# Patient Record
Sex: Male | Born: 1940 | Race: White | Hispanic: No | Smoking: Never smoker
Health system: Southern US, Community
[De-identification: ages and names within clinical notes are randomized; demographics above are authoritative.]

## PROBLEM LIST (undated history)

## (undated) DIAGNOSIS — K589 Irritable bowel syndrome without diarrhea: Secondary | ICD-10-CM

## (undated) DIAGNOSIS — E785 Hyperlipidemia, unspecified: Secondary | ICD-10-CM

## (undated) DIAGNOSIS — N400 Enlarged prostate without lower urinary tract symptoms: Secondary | ICD-10-CM

## (undated) DIAGNOSIS — I10 Essential (primary) hypertension: Secondary | ICD-10-CM

## (undated) DIAGNOSIS — N529 Male erectile dysfunction, unspecified: Secondary | ICD-10-CM

## (undated) DIAGNOSIS — M1990 Unspecified osteoarthritis, unspecified site: Secondary | ICD-10-CM

## (undated) DIAGNOSIS — E119 Type 2 diabetes mellitus without complications: Secondary | ICD-10-CM

## (undated) HISTORY — DX: Unspecified osteoarthritis, unspecified site: M19.90

## (undated) HISTORY — DX: Type 2 diabetes mellitus without complications: E11.9

## (undated) HISTORY — DX: Male erectile dysfunction, unspecified: N52.9

## (undated) HISTORY — DX: Hyperlipidemia, unspecified: E78.5

## (undated) HISTORY — DX: Essential (primary) hypertension: I10

## (undated) HISTORY — DX: Benign prostatic hyperplasia without lower urinary tract symptoms: N40.0

## (undated) HISTORY — PX: OTHER SURGICAL HISTORY: SHX169

## (undated) HISTORY — DX: Irritable bowel syndrome, unspecified: K58.9

---

## 1992-04-18 ENCOUNTER — Encounter: Payer: Self-pay | Admitting: Internal Medicine

## 1998-11-04 HISTORY — PX: OTHER SURGICAL HISTORY: SHX169

## 2002-08-10 ENCOUNTER — Encounter: Payer: Self-pay | Admitting: Neurosurgery

## 2002-08-10 ENCOUNTER — Ambulatory Visit (HOSPITAL_COMMUNITY): Admission: RE | Admit: 2002-08-10 | Discharge: 2002-08-10 | Payer: Self-pay | Admitting: Neurosurgery

## 2003-09-16 ENCOUNTER — Encounter: Payer: Self-pay | Admitting: Internal Medicine

## 2004-11-28 ENCOUNTER — Ambulatory Visit: Payer: Self-pay | Admitting: Internal Medicine

## 2004-12-18 ENCOUNTER — Ambulatory Visit: Payer: Self-pay | Admitting: Internal Medicine

## 2005-04-08 ENCOUNTER — Ambulatory Visit: Payer: Self-pay | Admitting: Internal Medicine

## 2005-05-27 ENCOUNTER — Ambulatory Visit: Payer: Self-pay | Admitting: Internal Medicine

## 2005-06-04 HISTORY — PX: US ECHOCARDIOGRAPHY: HXRAD669

## 2005-06-11 ENCOUNTER — Ambulatory Visit: Payer: Self-pay

## 2005-09-23 ENCOUNTER — Ambulatory Visit: Payer: Self-pay | Admitting: Internal Medicine

## 2006-06-19 ENCOUNTER — Ambulatory Visit: Payer: Self-pay | Admitting: Internal Medicine

## 2006-06-20 ENCOUNTER — Ambulatory Visit: Payer: Self-pay | Admitting: Internal Medicine

## 2006-08-13 ENCOUNTER — Encounter: Payer: Self-pay | Admitting: Internal Medicine

## 2006-09-02 ENCOUNTER — Ambulatory Visit: Payer: Self-pay | Admitting: Internal Medicine

## 2006-12-22 ENCOUNTER — Ambulatory Visit: Payer: Self-pay | Admitting: Internal Medicine

## 2007-01-22 ENCOUNTER — Ambulatory Visit: Payer: Self-pay | Admitting: Internal Medicine

## 2007-01-22 LAB — CONVERTED CEMR LAB
ALT: 14 units/L (ref 0–40)
Albumin: 3.6 g/dL (ref 3.5–5.2)
BUN: 14 mg/dL (ref 6–23)
CO2: 32 meq/L (ref 19–32)
Calcium: 8.9 mg/dL (ref 8.4–10.5)
Chloride: 103 meq/L (ref 96–112)
Cholesterol: 206 mg/dL (ref 0–200)
Creatinine, Ser: 0.8 mg/dL (ref 0.4–1.5)
Direct LDL: 145.1 mg/dL
GFR calc Af Amer: 125 mL/min
GFR calc non Af Amer: 103 mL/min
Glucose, Bld: 97 mg/dL (ref 70–99)
HDL: 46.4 mg/dL (ref >39.0)
PSA: 0.43 ng/mL (ref 0.10–4.00)
Phosphorus: 3.8 mg/dL (ref 2.3–4.6)
Potassium: 3.6 meq/L (ref 3.5–5.1)
Sodium: 140 meq/L (ref 135–145)
Total CHOL/HDL Ratio: 4.4
Triglycerides: 96 mg/dL (ref 0–149)
VLDL: 19 mg/dL (ref 0–40)

## 2007-02-06 ENCOUNTER — Ambulatory Visit: Payer: Self-pay | Admitting: Internal Medicine

## 2007-02-06 ENCOUNTER — Encounter: Payer: Self-pay | Admitting: Internal Medicine

## 2007-02-06 DIAGNOSIS — N400 Enlarged prostate without lower urinary tract symptoms: Secondary | ICD-10-CM

## 2007-02-06 DIAGNOSIS — E785 Hyperlipidemia, unspecified: Secondary | ICD-10-CM | POA: Insufficient documentation

## 2007-02-06 DIAGNOSIS — I10 Essential (primary) hypertension: Secondary | ICD-10-CM

## 2007-02-06 DIAGNOSIS — N529 Male erectile dysfunction, unspecified: Secondary | ICD-10-CM | POA: Insufficient documentation

## 2007-02-06 DIAGNOSIS — M5430 Sciatica, unspecified side: Secondary | ICD-10-CM | POA: Insufficient documentation

## 2007-03-06 ENCOUNTER — Ambulatory Visit: Payer: Self-pay | Admitting: Internal Medicine

## 2007-03-06 LAB — CONVERTED CEMR LAB
Albumin: 3.6 g/dL (ref 3.5–5.2)
BUN: 13 mg/dL (ref 6–23)
CO2: 34 meq/L — ABNORMAL HIGH (ref 19–32)
Calcium: 9 mg/dL (ref 8.4–10.5)
Chloride: 106 meq/L (ref 96–112)
Creatinine, Ser: 0.7 mg/dL (ref 0.4–1.5)
GFR calc Af Amer: 146 mL/min
GFR calc non Af Amer: 120 mL/min
Glucose, Bld: 95 mg/dL (ref 70–99)
Phosphorus: 3.3 mg/dL (ref 2.3–4.6)
Potassium: 3.3 meq/L — ABNORMAL LOW (ref 3.5–5.1)
Sodium: 143 meq/L (ref 135–145)

## 2007-08-04 ENCOUNTER — Ambulatory Visit: Payer: Self-pay | Admitting: Internal Medicine

## 2007-08-05 LAB — CONVERTED CEMR LAB
ALT: 20 units/L (ref 0–53)
Albumin: 3.5 g/dL (ref 3.5–5.2)
BUN: 10 mg/dL (ref 6–23)
CO2: 34 meq/L — ABNORMAL HIGH (ref 19–32)
Calcium: 9.2 mg/dL (ref 8.4–10.5)
Chloride: 101 meq/L (ref 96–112)
Cholesterol: 231 mg/dL (ref 0–200)
Creatinine, Ser: 0.8 mg/dL (ref 0.4–1.5)
Direct LDL: 166.1 mg/dL
GFR calc Af Amer: 124 mL/min
GFR calc non Af Amer: 103 mL/min
Glucose, Bld: 105 mg/dL — ABNORMAL HIGH (ref 70–99)
HDL: 38.4 mg/dL — ABNORMAL LOW (ref >39.0)
Phosphorus: 3.2 mg/dL (ref 2.3–4.6)
Potassium: 3.2 meq/L — ABNORMAL LOW (ref 3.5–5.1)
Sodium: 139 meq/L (ref 135–145)
Total CHOL/HDL Ratio: 6
Triglycerides: 122 mg/dL (ref 0–149)
VLDL: 24 mg/dL (ref 0–40)

## 2007-08-07 ENCOUNTER — Ambulatory Visit: Payer: Self-pay | Admitting: Internal Medicine

## 2007-08-07 DIAGNOSIS — G629 Polyneuropathy, unspecified: Secondary | ICD-10-CM

## 2007-08-07 DIAGNOSIS — K589 Irritable bowel syndrome without diarrhea: Secondary | ICD-10-CM

## 2007-08-14 ENCOUNTER — Telehealth: Payer: Self-pay | Admitting: Internal Medicine

## 2007-08-17 ENCOUNTER — Encounter: Payer: Self-pay | Admitting: Internal Medicine

## 2007-08-28 ENCOUNTER — Encounter: Payer: Self-pay | Admitting: Internal Medicine

## 2007-09-07 ENCOUNTER — Ambulatory Visit: Payer: Self-pay | Admitting: Internal Medicine

## 2007-09-08 LAB — CONVERTED CEMR LAB
ALT: 26 units/L (ref 0–53)
Albumin: 3.5 g/dL (ref 3.5–5.2)
BUN: 14 mg/dL (ref 6–23)
CO2: 34 meq/L — ABNORMAL HIGH (ref 19–32)
Calcium: 9 mg/dL (ref 8.4–10.5)
Chloride: 103 meq/L (ref 96–112)
Cholesterol: 179 mg/dL (ref 0–200)
Creatinine, Ser: 0.8 mg/dL (ref 0.4–1.5)
GFR calc Af Amer: 124 mL/min
GFR calc non Af Amer: 103 mL/min
Glucose, Bld: 102 mg/dL — ABNORMAL HIGH (ref 70–99)
HDL: 50.4 mg/dL (ref >39.0)
LDL Cholesterol: 113 mg/dL — ABNORMAL HIGH (ref 0–99)
Phosphorus: 3.5 mg/dL (ref 2.3–4.6)
Potassium: 4 meq/L (ref 3.5–5.1)
Sodium: 141 meq/L (ref 135–145)
TSH: 1.02 microintl units/mL (ref 0.35–5.50)
Total CHOL/HDL Ratio: 3.6
Triglycerides: 76 mg/dL (ref 0–149)
VLDL: 15 mg/dL (ref 0–40)
Vitamin B-12: 274 pg/mL (ref 211–911)

## 2008-02-04 ENCOUNTER — Ambulatory Visit: Payer: Self-pay | Admitting: Internal Medicine

## 2008-02-04 DIAGNOSIS — M47815 Spondylosis without myelopathy or radiculopathy, thoracolumbar region: Secondary | ICD-10-CM

## 2008-02-04 DIAGNOSIS — M159 Polyosteoarthritis, unspecified: Secondary | ICD-10-CM | POA: Insufficient documentation

## 2008-02-04 LAB — CONVERTED CEMR LAB
ALT: 18 units/L (ref 0–53)
AST: 18 units/L (ref 0–37)
Albumin: 3.9 g/dL (ref 3.5–5.2)
Alkaline Phosphatase: 49 units/L (ref 39–117)
BUN: 14 mg/dL (ref 6–23)
Bilirubin, Direct: 0.2 mg/dL (ref 0.0–0.3)
CO2: 33 meq/L — ABNORMAL HIGH (ref 19–32)
Calcium: 9.2 mg/dL (ref 8.4–10.5)
Chloride: 101 meq/L (ref 96–112)
Cholesterol: 160 mg/dL (ref 0–200)
Creatinine, Ser: 0.9 mg/dL (ref 0.4–1.5)
GFR calc Af Amer: 109 mL/min
GFR calc non Af Amer: 90 mL/min
Glucose, Bld: 108 mg/dL — ABNORMAL HIGH (ref 70–99)
HDL: 40.3 mg/dL (ref >39.0)
LDL Cholesterol: 97 mg/dL (ref 0–99)
Phosphorus: 3.6 mg/dL (ref 2.3–4.6)
Potassium: 4 meq/L (ref 3.5–5.1)
Sodium: 140 meq/L (ref 135–145)
Total Bilirubin: 1.9 mg/dL — ABNORMAL HIGH (ref 0.3–1.2)
Total CHOL/HDL Ratio: 4
Total Protein: 6.8 g/dL (ref 6.0–8.3)
Triglycerides: 115 mg/dL (ref 0–149)
VLDL: 23 mg/dL (ref 0–40)
Vitamin B-12: 699 pg/mL (ref 211–911)

## 2008-07-27 ENCOUNTER — Telehealth: Payer: Self-pay | Admitting: Internal Medicine

## 2008-08-03 ENCOUNTER — Encounter: Payer: Self-pay | Admitting: Internal Medicine

## 2008-08-15 ENCOUNTER — Encounter: Payer: Self-pay | Admitting: Internal Medicine

## 2009-02-14 ENCOUNTER — Telehealth: Payer: Self-pay | Admitting: Internal Medicine

## 2009-03-01 ENCOUNTER — Encounter: Payer: Self-pay | Admitting: Internal Medicine

## 2009-08-21 ENCOUNTER — Encounter: Payer: Self-pay | Admitting: Internal Medicine

## 2009-09-06 ENCOUNTER — Ambulatory Visit: Payer: Self-pay | Admitting: Internal Medicine

## 2009-09-08 LAB — CONVERTED CEMR LAB
ALT: 25 units/L (ref 0–53)
AST: 21 units/L (ref 0–37)
Albumin: 4.2 g/dL (ref 3.5–5.2)
Alkaline Phosphatase: 44 units/L (ref 39–117)
BUN: 15 mg/dL (ref 6–23)
Basophils Absolute: 0 10*3/uL (ref 0.0–0.1)
Basophils Relative: 0.3 % (ref 0.0–3.0)
Bilirubin, Direct: 0.1 mg/dL (ref 0.0–0.3)
CO2: 35 meq/L — ABNORMAL HIGH (ref 19–32)
Calcium: 9.4 mg/dL (ref 8.4–10.5)
Chloride: 96 meq/L (ref 96–112)
Cholesterol: 177 mg/dL (ref 0–200)
Creatinine, Ser: 0.9 mg/dL (ref 0.4–1.5)
Direct LDL: 114.7 mg/dL
Eosinophils Absolute: 0.2 10*3/uL (ref 0.0–0.7)
Eosinophils Relative: 2.4 % (ref 0.0–5.0)
GFR calc non Af Amer: 89.06 mL/min (ref >60)
Glucose, Bld: 90 mg/dL (ref 70–99)
HCT: 43.5 % (ref 39.0–52.0)
HDL: 38.5 mg/dL — ABNORMAL LOW (ref >39.00)
Hemoglobin: 14.8 g/dL (ref 13.0–17.0)
Lymphocytes Relative: 23.5 % (ref 12.0–46.0)
Lymphs Abs: 1.8 10*3/uL (ref 0.7–4.0)
MCHC: 34.1 g/dL (ref 30.0–36.0)
MCV: 91.1 fL (ref 78.0–100.0)
Monocytes Absolute: 0.7 10*3/uL (ref 0.1–1.0)
Monocytes Relative: 9.1 % (ref 3.0–12.0)
Neutro Abs: 4.8 10*3/uL (ref 1.4–7.7)
Neutrophils Relative %: 64.7 % (ref 43.0–77.0)
Phosphorus: 3.6 mg/dL (ref 2.3–4.6)
Platelets: 184 10*3/uL (ref 150.0–400.0)
Potassium: 3.4 meq/L — ABNORMAL LOW (ref 3.5–5.1)
RBC: 4.78 M/uL (ref 4.22–5.81)
RDW: 12.5 % (ref 11.5–14.6)
Sodium: 138 meq/L (ref 135–145)
TSH: 0.89 microintl units/mL (ref 0.35–5.50)
Total Bilirubin: 2.3 mg/dL — ABNORMAL HIGH (ref 0.3–1.2)
Total CHOL/HDL Ratio: 5
Total Protein: 6.8 g/dL (ref 6.0–8.3)
Triglycerides: 217 mg/dL — ABNORMAL HIGH (ref 0.0–149.0)
VLDL: 43.4 mg/dL — ABNORMAL HIGH (ref 0.0–40.0)
WBC: 7.5 10*3/uL (ref 4.5–10.5)

## 2009-10-10 ENCOUNTER — Ambulatory Visit: Payer: Self-pay | Admitting: Internal Medicine

## 2009-10-10 LAB — CONVERTED CEMR LAB
Albumin: 3.9 g/dL (ref 3.5–5.2)
BUN: 16 mg/dL (ref 6–23)
CO2: 33 meq/L — ABNORMAL HIGH (ref 19–32)
Calcium: 9.5 mg/dL (ref 8.4–10.5)
Chloride: 100 meq/L (ref 96–112)
Creatinine, Ser: 1 mg/dL (ref 0.4–1.5)
GFR calc non Af Amer: 78.84 mL/min (ref >60)
Glucose, Bld: 99 mg/dL (ref 70–99)
Phosphorus: 3.2 mg/dL (ref 2.3–4.6)
Potassium: 3.5 meq/L (ref 3.5–5.1)
Sodium: 140 meq/L (ref 135–145)

## 2009-10-31 ENCOUNTER — Telehealth: Payer: Self-pay | Admitting: Internal Medicine

## 2009-12-11 ENCOUNTER — Encounter: Payer: Self-pay | Admitting: Internal Medicine

## 2010-04-03 ENCOUNTER — Telehealth: Payer: Self-pay | Admitting: Internal Medicine

## 2010-04-04 ENCOUNTER — Ambulatory Visit: Payer: Self-pay | Admitting: Family Medicine

## 2010-04-04 ENCOUNTER — Telehealth: Payer: Self-pay | Admitting: Internal Medicine

## 2010-08-08 ENCOUNTER — Ambulatory Visit: Payer: Self-pay | Admitting: Internal Medicine

## 2010-08-08 ENCOUNTER — Encounter: Payer: Self-pay | Admitting: Internal Medicine

## 2010-08-09 LAB — CONVERTED CEMR LAB
ALT: 25 units/L (ref 0–53)
AST: 25 units/L (ref 0–37)
Albumin: 4.1 g/dL (ref 3.5–5.2)
Alkaline Phosphatase: 54 units/L (ref 39–117)
BUN: 19 mg/dL (ref 6–23)
Basophils Absolute: 0 10*3/uL (ref 0.0–0.1)
Basophils Relative: 0.3 % (ref 0.0–3.0)
Bilirubin, Direct: 0.1 mg/dL (ref 0.0–0.3)
CO2: 34 meq/L — ABNORMAL HIGH (ref 19–32)
Calcium: 9.8 mg/dL (ref 8.4–10.5)
Chloride: 99 meq/L (ref 96–112)
Cholesterol: 170 mg/dL (ref 0–200)
Creatinine, Ser: 0.9 mg/dL (ref 0.4–1.5)
Eosinophils Absolute: 0.1 10*3/uL (ref 0.0–0.7)
Eosinophils Relative: 1.8 % (ref 0.0–5.0)
GFR calc non Af Amer: 91.15 mL/min (ref >60)
Glucose, Bld: 88 mg/dL (ref 70–99)
HCT: 42.2 % (ref 39.0–52.0)
HDL: 46.8 mg/dL (ref >39.00)
Hemoglobin: 14.6 g/dL (ref 13.0–17.0)
LDL Cholesterol: 91 mg/dL (ref 0–99)
Lymphocytes Relative: 28.6 % (ref 12.0–46.0)
Lymphs Abs: 2.1 10*3/uL (ref 0.7–4.0)
MCHC: 34.5 g/dL (ref 30.0–36.0)
MCV: 89.6 fL (ref 78.0–100.0)
Monocytes Absolute: 0.7 10*3/uL (ref 0.1–1.0)
Monocytes Relative: 9.7 % (ref 3.0–12.0)
Neutro Abs: 4.4 10*3/uL (ref 1.4–7.7)
Neutrophils Relative %: 59.6 % (ref 43.0–77.0)
Phosphorus: 3.2 mg/dL (ref 2.3–4.6)
Platelets: 195 10*3/uL (ref 150.0–400.0)
Potassium: 3.8 meq/L (ref 3.5–5.1)
RBC: 4.71 M/uL (ref 4.22–5.81)
RDW: 13.8 % (ref 11.5–14.6)
Sodium: 140 meq/L (ref 135–145)
TSH: 1.26 microintl units/mL (ref 0.35–5.50)
Total Bilirubin: 1.5 mg/dL — ABNORMAL HIGH (ref 0.3–1.2)
Total CHOL/HDL Ratio: 4
Total Protein: 7 g/dL (ref 6.0–8.3)
Triglycerides: 160 mg/dL — ABNORMAL HIGH (ref 0.0–149.0)
VLDL: 32 mg/dL (ref 0.0–40.0)
WBC: 7.3 10*3/uL (ref 4.5–10.5)

## 2010-08-10 ENCOUNTER — Telehealth: Payer: Self-pay | Admitting: Internal Medicine

## 2010-08-22 ENCOUNTER — Encounter: Payer: Self-pay | Admitting: Internal Medicine

## 2010-09-03 ENCOUNTER — Encounter (INDEPENDENT_AMBULATORY_CARE_PROVIDER_SITE_OTHER): Payer: Self-pay | Admitting: *Deleted

## 2010-09-05 ENCOUNTER — Ambulatory Visit: Payer: Self-pay | Admitting: Internal Medicine

## 2010-09-19 ENCOUNTER — Ambulatory Visit: Payer: Self-pay | Admitting: Internal Medicine

## 2010-09-19 LAB — HM COLONOSCOPY

## 2010-09-20 ENCOUNTER — Encounter: Payer: Self-pay | Admitting: Internal Medicine

## 2010-12-04 NOTE — Progress Notes (Signed)
Summary: Diclofenac Sodium  Phone Note Call from Patient Call back at Home Phone [phone]   Caller: Patient Call For: Cindee Salt MD Summary of Call: patient came in this morning to see Dr. Dayton Martes for tick bites, pt then wanted a physical accessment form filled out and refill for Diclofenac Sodium, please advise if refill can be done, this was denied by Dr. Artis Flock, pt was told to see Dr. Wilkie Aye. Fax was dated 02/26/2010. Rx on your desk  Initial call taken by: Mervin Hack CMA Duncan Dull),  April 04, 2010 10:22 AM  Follow-up for Phone Call        left message on at home with wife for patient to return my call.  DeShannon Katrinka Blazing CMA Duncan Dull)  April 04, 2010 10:23 AM   Okay to refill #60 x 0 I assume he is just using it as needed  we can discuss this further at his July visit Follow-up by: Cindee Salt MD,  April 04, 2010 10:49 AM  Additional Follow-up for Phone Call Additional follow up Details #1::        patient states he doesn't need the refill, [facility] already refilled, patient states he just needs it recorded that Dr. Alphonsus Sias refilled it? DeShannon Smith CMA Duncan Dull)  April 04, 2010 12:02  Please just add it to his med list if he will be using at times Cindee Salt MD  April 04, 2010 1:34 PM   done Additional Follow-up by: Mervin Hack CMA Duncan Dull),  April 04, 2010 4:15 PM    New/Updated Medications: DICLOFENAC SODIUM 50 MG TBEC (DICLOFENAC SODIUM) use as directed

## 2010-12-04 NOTE — Miscellaneous (Signed)
Summary: Health Care Power of [facility] Power of Attorney   Imported By: Beau Fanny 08/08/2010 14:47:19  _____________________________________________________________________  External Attachment:    Type:   Image     Comment:   External Document

## 2010-12-04 NOTE — Letter (Signed)
Summary: [facility]   Imported By: Lanelle Bal 08/30/2010 13:01:33  _____________________________________________________________________  External Attachment:    Type:   Image     Comment:   External Document  Appended Document: [facility] BPH okay on avodart PSA drawn

## 2010-12-04 NOTE — Progress Notes (Signed)
Summary: ??infected tick bite  Phone Note Call from Patient Call back at Home Phone [phone]   Caller: Patient Call For: Cindee Salt MD Summary of Call: Patient sayst that he has a couple of tick bites that look infected. They are very red, and feel hard, and warm to the touch. He feels fine and has not had fever.  He has an appt with dr. Dayton Martes tomorrow, he wants to know if he needs to be seen today or if it is okay to wait until tomorrow.  Initial call taken by: Melody Comas,  Apr 03, 2010 9:19 AM  Follow-up for Phone Call        probably can wait till tomorrow but if noone else can really fit him in today, add on at 1PM for me Follow-up by: Cindee Salt MD,  Apr 03, 2010 10:00 AM  Additional Follow-up for Phone Call Additional follow up Details #1::        pt states he can't come today at 1:00, he will be seen by Dr. Dayton Martes tomorrow.  DeShannon Smith CMA Duncan Dull)  Apr 03, 2010 10:26 AM   Okay Additional Follow-up by: Cindee Salt MD,  Apr 03, 2010 10:41 AM

## 2010-12-04 NOTE — Progress Notes (Signed)
Summary: Previst Letter  Phone Note Call from Patient Call back at Home Phone [phone]   Caller: Patient Call For: Dr. Marina Goodell Reason for Call: Talk to Nurse Summary of Call: Calling about his Pre-visit letter he received in the mail Initial call taken by: Karna Christmas,  August 10, 2010 9:09 AM  Follow-up for Phone Call        called patient and left message with wife for him to return my call. Milford Cage [facility]  August 10, 2010 9:48 AM Pt. called back and advised that he had a colonoscopy 10 years ago in [place].  Note sent to Dr. Marina Goodell regarding what he wants to do regarding his procedure.  Milford Cage [facility]  August 10, 2010 9:56 AM

## 2010-12-04 NOTE — Letter (Signed)
Summary: [facility] Instructions  [facility]  [address] [place], [place] [address]   Phone: [phone]  Fax: [phone]       Brendan Barnes    Apr 15, 1941    MRN: [id number]        Procedure Day Dorna Bloom:  Wednesday 09/19/2010     Arrival Time: 10:30 am      Procedure Time: 11:30 am     Location of Procedure:                    _x _  [facility] (4th Floor)                        PREPARATION FOR COLONOSCOPY WITH MOVIPREP   Starting 5 days prior to your procedure Friday 09/14/10 do not eat nuts, seeds, popcorn, corn, beans, peas,  salads, or any raw vegetables.  Do not take any fiber supplements (e.g. Metamucil, Citrucel, and Benefiber).  THE DAY BEFORE YOUR PROCEDURE         DATE: Tuesday  09/18/10  1.  Drink clear liquids the entire day-NO SOLID FOOD  2.  Do not drink anything colored red or purple.  Avoid juices with pulp.  No orange juice.  3.  Drink at least 64 oz. (8 glasses) of fluid/clear liquids during the day to prevent dehydration and help the prep work efficiently.  CLEAR LIQUIDS INCLUDE: Water Jello Ice Popsicles Tea (sugar ok, no milk/cream) Powdered fruit flavored drinks Coffee (sugar ok, no milk/cream) Gatorade Juice: apple, white grape, white cranberry  Lemonade Clear bullion, consomm, broth Carbonated beverages (any kind) Strained chicken noodle soup Hard Candy                             4.  In the morning, mix first dose of MoviPrep solution:    Empty 1 Pouch A and 1 Pouch B into the disposable container    Add lukewarm drinking water to the top line of the container. Mix to dissolve    Refrigerate (mixed solution should be used within 24 hrs)  5.  Begin drinking the prep at 5:00 p.m. The MoviPrep container is divided by 4 marks.   Every 15 minutes drink the solution down to the next mark (approximately 8 oz) until the full liter is complete.   6.  Follow completed prep with 16 oz of clear liquid of your  choice (Nothing red or purple).  Continue to drink clear liquids until bedtime.  7.  Before going to bed, mix second dose of MoviPrep solution:    Empty 1 Pouch A and 1 Pouch B into the disposable container    Add lukewarm drinking water to the top line of the container. Mix to dissolve    Refrigerate  THE DAY OF YOUR PROCEDURE      DATE:  Wednesday  09/19/10  Beginning at 6:30 a.m. (5 hours before procedure):         1. Every 15 minutes, drink the solution down to the next mark (approx 8 oz) until the full liter is complete.  2. Follow completed prep with 16 oz. of clear liquid of your choice.    3. You may drink clear liquids until 9:30 am (2 HOURS BEFORE PROCEDURE).   MEDICATION INSTRUCTIONS  Unless otherwise instructed, you should take regular prescription medications with a small sip of water   as early as possible  the morning of your procedure.  Additional medication instructions: Hold Lisinopril/HCTZ the morning of procedure.         OTHER INSTRUCTIONS  You will need a responsible adult at least 70 years of age to accompany you and drive you home.   This person must remain in the waiting room during your procedure.  Wear loose fitting clothing that is easily removed.  Leave jewelry and other valuables at home.  However, you may wish to bring a book to read or  an iPod/MP3 player to listen to music as you wait for your procedure to start.  Remove all body piercing jewelry and leave at home.  Total time from sign-in until discharge is approximately 2-3 hours.  You should go home directly after your procedure and rest.  You can resume normal activities the  day after your procedure.  The day of your procedure you should not:   Drive   Make legal decisions   Operate machinery   Drink alcohol   Return to work  You will receive specific instructions about eating, activities and medications before you leave.    The above instructions have been reviewed  and explained to me by   Wyona Almas RN  September 05, 2010 10:34 AM     I fully understand and can verbalize these instructions _____________________________ Date _________

## 2010-12-04 NOTE — Progress Notes (Signed)
Summary: pre-visit letter/colonoscopy  Phone Note Call from Patient   Caller: Patient Call For: Milford Cage, CMA Summary of Call: Patient received the pre-visit letter and he has had a colonoscopy 10 years ago in [place].  He is not sure who did the procedure or if he can get a copy of this report.  Do you want me to try and get the report, schedule an office visit,  or just keep the direct colonoscopy appt. as scheduled?  Please advise-thanks.   (Pt. called back and did find the report.  It was done on 06/30/2000 by Dr. Telford Nab in [place].  Results-Normal and recommended colon in 10 years.)  Milford Cage [facility]  August 10, 2010 10:14 AM  Initial call taken by: Milford Cage NCMA,  August 10, 2010 9:54 AM  Follow-up for Phone Call        keep plans for direct colonoscopy Follow-up by: Hilarie Fredrickson MD,  August 11, 2010 12:44 PM  Additional Follow-up for Phone Call Additional follow up Details #1::        pt. notified.   Additional Follow-up by: Milford Cage NCMA,  August 13, 2010 8:22 AM

## 2010-12-04 NOTE — Procedures (Signed)
Summary: Colonoscopy  Patient: Keaton Beichner Note: All result statuses are Final unless otherwise noted.  Tests: (1) Colonoscopy (COL)   COL Colonoscopy           DONE     [facility]     [address]     [place], [place]  [address]           COLONOSCOPY PROCEDURE REPORT           PATIENT:  Edwing, Figley  MR#:  [id number]     BIRTHDATE:  [birth year], 69 yrs. old  GENDER:  male     ENDOSCOPIST:  Wilhemina Bonito. Eda Keys, MD     REF. BY:  Tillman Abide, M.D.     PROCEDURE DATE:  09/19/2010     PROCEDURE:  Colonoscopy with snare polypectomy x 2     ASA CLASS:  Class II     INDICATIONS:  Routine Risk Screening ; [place]     in [place] w/o polyps     MEDICATIONS:   Fentanyl 100 mcg IV, Versed 9 mg IV           DESCRIPTION OF PROCEDURE:   After the risks benefits and     alternatives of the procedure were thoroughly explained, informed     consent was obtained.  Digital rectal exam was performed and     revealed no abnormalities.   The LB 180AL [id number] endoscope was     introduced through the anus and advanced to the cecum, which was     identified by both the appendix and ileocecal valve, without     limitations.Time to cecum = 4:00 min.  The quality of the prep was     excellent, using MoviPrep.  The instrument was then slowly     withdrawn (time = 17:00 min) as the colon was fully examined.     <<PROCEDUREIMAGES>>           FINDINGS:  Two 3mm polyps were found in the cecum and ascending     colon respectively. Polyps were snared without cautery. Retrieval     was successful.  Mild diverticulosis was found in the sigmoid     colon.   Retroflexed views in the rectum revealed no     abnormalities.    The scope was then withdrawn from the patient     and the procedure completed.           COMPLICATIONS:  None     ENDOSCOPIC IMPRESSION:     1) Two polyps - removed     2) Mild diverticulosis in the sigmoid colon     3) Normal colonoscopy otherwise         RECOMMENDATIONS:     1) Repeat colonoscopy in 5 years if polyp adenomatous; otherwise     10 years           ______________________________     Wilhemina Bonito. Eda Keys, MD           CC:  Karie Schwalbe, MD;  The Patient           n.     eSIGNED:   Wilhemina Bonito. Eda Keys at 09/19/2010 01:17 PM           Weston Settle, [id number]  Note: An exclamation mark (!) indicates a result that was not dispersed into the flowsheet. Document Creation Date: 09/19/2010 1:17 PM _______________________________________________________________________  (1) Order result status: Final Collection or  observation date-time: 09/19/2010 13:09 Requested date-time:  Receipt date-time:  Reported date-time:  Referring Physician:   Ordering Physician: Fransico Setters [id number]) Specimen Source:  Source: Launa Grill Order Number: [id number] Lab site:   Appended Document: Colonoscopy recall 5 yrs     Procedures Next Due Date:    Colonoscopy: 10/2015

## 2010-12-04 NOTE — Miscellaneous (Signed)
Summary: [facility] Previsit/prep  Clinical Lists Changes  Medications: Added new medication of MOVIPREP 100 GM  SOLR (PEG-KCL-NACL-NASULF-NA ASC-C) As per prep instructions. - Signed Rx of MOVIPREP 100 GM  SOLR (PEG-KCL-NACL-NASULF-NA ASC-C) As per prep instructions.;  #1 x 0;  Signed;  Entered by: Wyona Almas RN;  Authorized by: Hilarie Fredrickson MD;  Method used: Electronically to [facility] [address] [place], [place], [place]  [address], Ph: [phone], Fax: [phone] Allergies: Removed allergy or adverse reaction of LEVSIN (HYOSCYAMINE SULFATE)    Prescriptions: MOVIPREP 100 GM  SOLR (PEG-KCL-NACL-NASULF-NA ASC-C) As per prep instructions.  #1 x 0   Entered by:   Wyona Almas RN   Authorized by:   Hilarie Fredrickson MD   Signed by:   Wyona Almas RN on 09/05/2010   Method used:   Electronically to        [facility] (retail)       [address]       [place], [place]  [address]       Ph: [phone]       Fax: [phone]   RxID:   [id number]

## 2010-12-04 NOTE — Letter (Signed)
Summary: Pre Visit Letter Revised  [facility]  [address] [place], [place] [address]   Phone: [phone]  Fax: [phone]        08/08/2010 MRN: [id number] [facility] [address] [place], [place]  [address]             Procedure Date:  11-16 at 11:30am  Welcome to the Gastroenterology Division at [facility].    You are scheduled to see a nurse for your pre-procedure visit on 09-05-10 at 11am  on the 3rd floor at [facility], [address]  We ask that you try to arrive at our office 15 minutes prior to your appointment time to allow for check-in.  Please take a minute to review the attached form.  If you answer "Yes" to one or more of the questions on the first page, we ask that you call the person listed at your earliest opportunity.  If you answer "No" to all of the questions, please complete the rest of the form and bring it to your appointment.    Your nurse visit will consist of discussing your medical and surgical history, your immediate family medical history, and your medications.   If you are unable to list all of your medications on the form, please bring the medication bottles to your appointment and we will list them.  We will need to be aware of both prescribed and over the counter drugs.  We will need to know exact dosage information as well.    Please be prepared to read and sign documents such as consent forms, a financial agreement, and acknowledgement forms.  If necessary, and with your consent, a friend or relative is welcome to sit-in on the nurse visit with you.  Please bring your insurance card so that we may make a copy of it.  If your insurance requires a referral to see a specialist, please bring your referral form from your primary care physician.  No co-pay is required for this nurse visit.     If you cannot keep your appointment, please call [phone] to cancel or reschedule prior to your appointment date.   This allows [place] the opportunity to schedule an appointment for another patient in need of care.    Thank you for choosing  Gastroenterology for your medical needs.  We appreciate the opportunity to care for you.  Please visit [place] at our website  to learn more about our practice.  Sincerely, The Gastroenterology Division

## 2010-12-04 NOTE — Assessment & Plan Note (Signed)
Summary: TICK BITE/CLE   Vital Signs:  Patient profile:   70 year old male Height:      71.5 inches Weight:      218.13 pounds BMI:     30.11 Temp:     97.6 degrees F oral Pulse rate:   64 / minute Pulse rhythm:   regular BP sitting:   132 / 88  (left arm) Cuff size:   regular  Vitals Entered By: Linde Gillis CMA Duncan Dull) (April 04, 2010 9:37 AM) CC: ? tick bite   History of Present Illness: 70 yo here for ? infected tick bite.  Pulled two ticks off of him two days ago, one on left thigh, one on left upper back.   Has had multiple tick bites in past, developped red area around one on thigh that is typical for his previous tick bite but feels red area on his back is much larger. No rash evident elsewhere. No malaise, fever, chills, body aches, joint pain, headache or other systemic symptoms.  Current Medications (verified): 1)  Aspirin 81 Mg  Tbec (Aspirin) .... Take One By Mouth Once A Day 2)  Avodart 0.5 Mg  Caps (Dutasteride) .... Take One By Mouth Once A Day 3)  Omega-3 1000 Mg  Caps (Omega-3 Fatty Acids) .... Take One By Mouth Once A Day 4)  Prinzide 20-25 Mg  Tabs (Lisinopril-Hydrochlorothiazide) .... Take 1 Tablet By Mouth Once A Day 5)  Simvastatin 40 Mg  Tabs (Simvastatin) .Marland Kitchen.. 1 Daily 6)  Calcium 500 Mg Tabs (Calcium Carbonate) .... Take 2 By Mouth Once Daily (1000mg ) 7)  Amlodipine Besylate 5 Mg Tabs (Amlodipine Besylate) .Marland Kitchen.. 1 Tab Daily For High Blood Pressure 8)  Vitamin B-12 1000 Mcg Tabs (Cyanocobalamin) .... Take 1 By Mouth Once Daily 9)  Vitamin D3 1000 Unit Caps (Cholecalciferol) .... Take 1 By Mouth Once Daily 10)  Doxycycline Hyclate 100 Mg Caps (Doxycycline Hyclate) .... Take 1 Tab Twice A Day X 14 Days  Allergies: 1)  Levsin (Hyoscyamine Sulfate)  Past History:  Past Medical History: Last updated: 02/04/2008 Hyperlipidemia Hypertension Benign prostatic hypertrophy Erectile dysfunction IBS Osteoarthritis  [facility] Dr Achilles Dunk  [phone] Dr  Gerlene Fee  [phone] Dr Tresa Endo  [phone]  Past Surgical History: Last updated: 07/22/2007 Nasal "growth" 2000 Echo- LV EF normal, mild LVH, LAE 08/06  Family History: Last updated: 07/22/2007 Father: with  prostate cancer, elevated chol, arrhythmia Mother: Died at age 31, renal failure/CHF Gout Only child No CAD, HTN, DM No colon cancer  Social History: Last updated: 07/22/2007 Marital Status: Married Children: 2 Occupation: Retired Company secretary, [employer] Never Smoked Alcohol use-occ  Review of Systems      See HPI General:  Denies chills, fatigue, and fever. GI:  Denies abdominal pain, nausea, and vomiting. MS:  Denies joint pain, joint redness, and joint swelling.  Physical Exam  General:  alert.  NAD Skin:  2 cm raised, erythematous area on left upper thigh. 2 cm raised erythematous area on left upper back with surrounding area of lighter erythema (approx 10 cm), warm to touch Psych:  normally interactive, good eye contact, not anxious appearing, and not depressed appearing.     Impression & Recommendations:  Problem # 1:  TICK BITE (ICD-E906.4) Assessment New  70 year old male Height:      71.5 inches Weight:      218.13 pounds BMI:     30.11 Temp:     97.6 degrees F oral Pulse rate:   64 / minute Pulse rhythm:     regular BP sitting:   132 / 88  (left arm) Cuff size:  regular  Vitals Entered By: Linde Gillis CMA Duncan Dull) (April 04, 2010 9:37 AM) CC: ? tick bite   History of Present Illness: 70 yo here for ? infected tick bite.  Pulled two ticks off of him two days ago, one on left thigh, one on left upper back.   Has had multiple tick bites in past, developped red area around one on thigh that is typical for his previous tick bite but feels red area on his back is much larger. No rash evident elsewhere. No malaise, fever, chills, body aches, joint pain, headache or other systemic symptoms.  Current Medications (verified): 1)  Aspirin 81 Mg  Tbec (Aspirin) .... Take One By Mouth Once A Day 2)  Avodart 0.5 Mg  Caps (Dutasteride) .... Take One By Mouth Once A Day 3)  Omega-3 1000 Mg  Caps (Omega-3 Fatty Acids) .... Take One By Mouth Once A Day 4)  Prinzide 20-25 Mg  Tabs (Lisinopril-Hydrochlorothiazide) .... Take 1 Tablet By Mouth Once A Day 5)  Simvastatin 40 Mg  Tabs (Simvastatin) .Marland Kitchen.. 1 Daily 6)  Calcium 500 Mg Tabs (Calcium Carbonate) .... Take 2 By Mouth Once Daily (1000mg ) 7)  Amlodipine Besylate 5 Mg Tabs (Amlodipine Besylate) .Marland Kitchen.. 1 Tab Daily For High Blood Pressure 8)  Vitamin B-12 1000 Mcg Tabs (Cyanocobalamin) .... Take 1 By Mouth Once Daily 9)  Vitamin D3 1000 Unit Caps (Cholecalciferol) .... Take 1 By Mouth Once Daily 10)  Doxycycline Hyclate 100 Mg Caps (Doxycycline Hyclate) .... Take 1 Tab Twice A Day X 14 Days  Allergies: 1)  Levsin (Hyoscyamine Sulfate)  Past History:  Past Medical History: Last updated: 02/04/2008 Hyperlipidemia Hypertension Benign prostatic hypertrophy Erectile dysfunction IBS Osteoarthritis  [facility] Dr Achilles Dunk  [phone] Dr  Gerlene Fee  [phone] Dr Tresa Endo  [phone]  Past Surgical History: Last updated: 07/22/2007 Nasal  on thigh. BIte on back looks like it does have some surrounding cellulitis vs very early erythema migrans.   No systemic symptoms. Will treat with 2 week course of doxy, follow up with primary  if he would like titers drawn (too soon after bite to check today). Pt in agreement with plan.  Orders: Prescription Created Electronically [id number])  Complete Medication List: 1)  Aspirin 81 Mg Tbec (Aspirin) .... Take one by mouth once a day 2)  Avodart 0.5 Mg Caps (Dutasteride) .... Take one by mouth once a day 3)  Omega-3 1000 Mg Caps (Omega-3 fatty acids) .... Take one by mouth once a day 4)  Prinzide 20-25 Mg Tabs (Lisinopril-hydrochlorothiazide) .... Take 1 tablet by mouth once a day 5)  Simvastatin 40 Mg Tabs (Simvastatin) .Marland Kitchen.. 1 daily 6)  Calcium 500 Mg Tabs (Calcium  carbonate) .... Take 2 by mouth once daily (1000mg ) 7)  Amlodipine Besylate 5 Mg Tabs (Amlodipine besylate) .Marland Kitchen.. 1 tab daily for high blood pressure 8)  Vitamin B-12 1000 Mcg Tabs (Cyanocobalamin) .... Take 1 by mouth once daily 9)  Vitamin D3 1000 Unit Caps (Cholecalciferol) .... Take 1 by mouth once daily 10)  Doxycycline Hyclate 100 Mg Caps (Doxycycline hyclate) .... Take 1 tab twice a day x 14 days Prescriptions: DOXYCYCLINE HYCLATE 100 MG CAPS (DOXYCYCLINE HYCLATE) Take 1 tab twice a day x 14 days  #28 x 0   Entered and Authorized by:   Ruthe Mannan MD   Signed by:   Ruthe Mannan MD on 04/04/2010   Method used:   Electronically to        [facility] (retail)       [address]       [place], [place]  [address]       Ph: [phone]       Fax: [phone]   RxID:   [id number]   Current Allergies (reviewed today): LEVSIN (HYOSCYAMINE SULFATE)

## 2010-12-04 NOTE — Assessment & Plan Note (Signed)
Summary: F/U/R/S FROM 05/23/10/[facility]   Vital Signs:  Patient profile:   70 year old male Weight:      218 pounds Temp:     97.9 degrees F oral Pulse rate:   76 / minute Pulse rhythm:   regular BP sitting:   140 / 80  (left arm) Cuff size:   large  Vitals Entered By: Mervin Hack CMA Duncan Dull) (August 08, 2010 10:24 AM) CC: follow-up visit   History of Present Illness: has been doing well reviewed his health care POA--wants his wife to make decisions  Stopped all supplements except fish oil and multivitamin Can't tell any difference in how he feels  Checks his BP occ Generally runs  ~130-140/80 No headaches No chest pain No SOB No edema  Voiding okay due for urology appt No nocturia  No arthritis pain Did strain back while weed wacking once occ knee pain also does well with occ diclofenac  No myalgias doing well on this  Allergies: 1)  Levsin (Hyoscyamine Sulfate)  Past History:  Past medical, surgical, family and social histories (including risk factors) reviewed for relevance to current acute and chronic problems.  Past Medical History: Reviewed history from 02/04/2008 and no changes required. Hyperlipidemia Hypertension Benign prostatic hypertrophy Erectile dysfunction IBS Osteoarthritis  CONSULTANTS Dr Achilles Dunk  [phone] Dr Gerlene Fee  [phone] Dr Tresa Endo  [phone]  Past Surgical History: Reviewed history from 07/22/2007 and no changes required. Nasal "growth" 2000 Echo- LV EF normal, mild LVH, LAE 08/06  Family History: Reviewed history from 07/22/2007 and no changes required. 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 Request wife. Will accept resuscitation  Review of Systems       No major exercise weight is stable Does try to  be careful with diet  Physical Exam  General:  alert and normal appearance.   Neck:  supple, no masses, no thyromegaly, no carotid bruits, and no cervical lymphadenopathy.   Lungs:  normal respiratory effort, no intercostal retractions, no accessory muscle use, and normal breath sounds.   Heart:  normal rate, regular rhythm, no murmur, and no gallop.   Abdomen:  soft and non-tender.   Msk:  no joint tenderness and no joint swelling.   Pulses:  1+ in feet Extremities:  no edema Neurologic:  alert & oriented X3, strength normal in all extremities, and gait normal.   Skin:  no rashes and no suspicious lesions.   Psych:  normally interactive, good eye contact, not anxious appearing, and not depressed appearing.     Impression & Recommendations:  Problem # 1:  HYPERTENSION (ICD-401.9) Assessment Unchanged  Bp is fine discussed fitness check labs  His updated medication list for this problem includes:    Amlodipine Besylate 5 Mg Tabs (Amlodipine besylate) .Marland Kitchen... 1 tab daily for high blood pressure    Prinzide 20-25 Mg Tabs (Lisinopril-hydrochlorothiazide) .Marland Kitchen... Take 1 tablet by mouth once a day  BP today: 140/80 Prior BP: 132/88 (04/04/2010)  Prior 10 Yr Risk Heart Disease: Not enough information (02/06/2007)  Labs Reviewed: K+: 3.5 (10/10/2009) Creat: : 1.0 (10/10/2009)   Chol: 177 (09/06/2009)   HDL: 38.50 (09/06/2009)   LDL: 97 (02/04/2008)   TG: 217.0 (09/06/2009)  Orders: Venipuncture (07371) TLB-Renal Function Panel (80069-RENAL) TLB-CBC Platelet - w/Differential (85025-CBCD) TLB-TSH (Thyroid Stimulating Hormone) (84443-TSH)  Problem # 2:  HYPERLIPIDEMIA (ICD-272.4) Assessment: Unchanged  no problems with the meds will check labs  His updated medication list for this problem includes:    Simvastatin 40 Mg Tabs (Simvastatin) .Marland Kitchen... 1  daily  Labs Reviewed: SGOT: 21 (09/06/2009)   SGPT: 25 (09/06/2009)  Prior 10 Yr Risk Heart Disease: Not enough information (02/06/2007)   HDL:38.50 (09/06/2009), 40.3 (02/04/2008)  LDL:97 (02/04/2008), 113 (04/29/9484)  Chol:177 (09/06/2009), 160 (02/04/2008)  Trig:217.0 (09/06/2009), 115 (02/04/2008)  Orders: TLB-Lipid Panel (80061-LIPID) TLB-Hepatic/Liver Function Pnl (80076-HEPATIC)  Problem # 3:  OSTEOARTHRITIS (ICD-715.90) Assessment: Improved only rarely needs the diclofenac  His updated medication list for this problem includes:    Diclofenac Sodium 50 Mg Tbec (Diclofenac sodium) ..... Use as directed    Aspirin 81 Mg Tbec (Aspirin) .Marland Kitchen... Take one by mouth once a day  Complete Medication List: 1)  Amlodipine Besylate 5 Mg Tabs (Amlodipine besylate) .Marland Kitchen.. 1 tab daily for high blood pressure 2)  Simvastatin 40 Mg Tabs (Simvastatin) .Marland Kitchen.. 1 daily 3)  Prinzide 20-25 Mg Tabs (Lisinopril-hydrochlorothiazide) .... Take 1 tablet by mouth once a day 4)  Avodart 0.5 Mg Caps (Dutasteride) .... Take one by mouth once a day 5)  Diclofenac Sodium 50 Mg Tbec (Diclofenac sodium) .... Use as directed 6)  Aspirin 81 Mg Tbec (Aspirin) .... Take one by mouth once a day 7)  Omega-3 1000 Mg Caps (Omega-3 fatty acids) .... Take one by mouth once a day  Other Orders: Gastroenterology Referral (GI)  Patient Instructions: 1)  Please schedule a follow-up appointment in 6 months for annual wellness visit 2)  Schedule a colonoscopy/ sigmoidoscopy to help detect colon cancer.  Prescriptions: PRINZIDE 20-25 MG  TABS (LISINOPRIL-HYDROCHLOROTHIAZIDE) Take 1 tablet by mouth once a day  #90 x 3   Entered and Authorized by:   Cindee Salt MD   Signed by:   Cindee Salt MD on 08/08/2010   Method used:   Print then Give to Patient   RxID:   [id number] SIMVASTATIN 40 MG  TABS (SIMVASTATIN) 1 daily  #90 x 3   Entered and Authorized by:   Cindee Salt MD   Signed by:   Cindee Salt MD on 08/08/2010   Method used:   Print then Give to Patient   RxID:   [id number] AMLODIPINE BESYLATE 5 MG TABS (AMLODIPINE BESYLATE) 1 tab daily for high blood pressure  #90 x 3   Entered and Authorized by:   Cindee Salt MD   Signed by:   Cindee Salt MD on 08/08/2010   Method used:   Print then Give to Patient   RxID:   [id number]   Current Allergies (reviewed today): LEVSIN (HYOSCYAMINE SULFATE)

## 2010-12-04 NOTE — Letter (Signed)
Summary: Physician [facility] Wellness Program  Physician [facility] Wellness Program   Imported By: Maryln Gottron 12/13/2009 15:52:08  _____________________________________________________________________  External Attachment:    Type:   Image     Comment:   External Document

## 2010-12-04 NOTE — Letter (Signed)
Summary: Patient Notice- Polyp Results  [facility]  [address] [place], [place] [address]   Phone: [phone]  Fax: [phone]        September 20, 2010 MRN: [id number]    Brendan Barnes [address] [place], [place]  [address]    Dear Brendan Barnes,  I am pleased to inform you that the colon polyp(s) removed during your recent colonoscopy was (were) found to be benign (no cancer detected) upon pathologic examination.  I recommend you have a repeat colonoscopy examination in 5 years to look for recurrent polyps, as having colon polyps increases your risk for having recurrent polyps or even colon cancer in the future.  Should you develop new or worsening symptoms of abdominal pain, bowel habit changes or bleeding from the rectum or bowels, please schedule an evaluation with either your primary care physician or with me.  Additional information/recommendations:  __ No further action with gastroenterology is needed at this time. Please      follow-up with your primary care physician for your other healthcare      needs.   Please call us if you are having persistent problems or have questions about your condition that have not been fully answered at this time.  Sincerely,  Hilarie Fredrickson MD  This letter has been electronically signed by your physician.  Appended Document: Patient Notice- Polyp Results Letter mailed

## 2010-12-19 ENCOUNTER — Encounter: Payer: Self-pay | Admitting: *Deleted

## 2010-12-19 DIAGNOSIS — I10 Essential (primary) hypertension: Secondary | ICD-10-CM | POA: Insufficient documentation

## 2010-12-19 DIAGNOSIS — N529 Male erectile dysfunction, unspecified: Secondary | ICD-10-CM

## 2010-12-19 DIAGNOSIS — M1990 Unspecified osteoarthritis, unspecified site: Secondary | ICD-10-CM

## 2010-12-19 DIAGNOSIS — E785 Hyperlipidemia, unspecified: Secondary | ICD-10-CM | POA: Insufficient documentation

## 2010-12-19 DIAGNOSIS — K589 Irritable bowel syndrome without diarrhea: Secondary | ICD-10-CM | POA: Insufficient documentation

## 2010-12-19 DIAGNOSIS — N400 Enlarged prostate without lower urinary tract symptoms: Secondary | ICD-10-CM | POA: Insufficient documentation

## 2011-02-04 ENCOUNTER — Other Ambulatory Visit: Payer: Self-pay | Admitting: *Deleted

## 2011-02-04 MED ORDER — LISINOPRIL-HYDROCHLOROTHIAZIDE 20-25 MG PO TABS: 1.0000 | ORAL_TABLET | Freq: Every day | ORAL | Status: DC

## 2011-02-13 ENCOUNTER — Ambulatory Visit: Payer: Self-pay | Admitting: Internal Medicine

## 2011-02-19 ENCOUNTER — Ambulatory Visit (INDEPENDENT_AMBULATORY_CARE_PROVIDER_SITE_OTHER): Payer: Medicare Other | Admitting: Internal Medicine

## 2011-02-19 ENCOUNTER — Encounter: Payer: Self-pay | Admitting: Internal Medicine

## 2011-02-19 VITALS — BP 120/80 | HR 74 | Temp 98.5°F | Ht 71.0 in | Wt 218.0 lb

## 2011-02-19 DIAGNOSIS — N400 Enlarged prostate without lower urinary tract symptoms: Secondary | ICD-10-CM

## 2011-02-19 DIAGNOSIS — E785 Hyperlipidemia, unspecified: Secondary | ICD-10-CM

## 2011-02-19 DIAGNOSIS — M1990 Unspecified osteoarthritis, unspecified site: Secondary | ICD-10-CM

## 2011-02-19 DIAGNOSIS — I10 Essential (primary) hypertension: Secondary | ICD-10-CM

## 2011-02-19 DIAGNOSIS — Z0000 Encounter for general adult medical examination without abnormal findings: Secondary | ICD-10-CM | POA: Insufficient documentation

## 2011-02-19 MED ORDER — LISINOPRIL-HYDROCHLOROTHIAZIDE 20-25 MG PO TABS: 1.0000 | ORAL_TABLET | Freq: Every day | ORAL | Status: DC

## 2011-02-19 NOTE — Progress Notes (Signed)
Subjective:    Patient ID: Brendan Barnes, male    DOB: 1941/07/31, 70 y.o.   MRN: [id number]  HPI Doing okay Reviewed his wellness form  Notes some leg cramps--generally occurs if he skips his AM banana Discussed trying OTC potassium--he had used Rx in past  Voiding okay Keeps up with Dr Achilles Dunk Does PSAs due to strong FH of prostate cancer  No problems on chol meds No myalgias No GI problems  No arthritis pain lately No meds recently  Current outpatient prescriptions:amLODipine (NORVASC) 5 MG tablet, Take 5 mg by mouth daily. For high blood pressure , Disp: , Rfl: ;  aspirin 81 MG tablet, Take 81 mg by mouth daily.  , Disp: , Rfl: ;  diclofenac (CATAFLAM) 50 MG tablet, Take 50 mg by mouth daily as needed. , Disp: , Rfl: ;  dutasteride (AVODART) 0.5 MG capsule, Take 0.5 mg by mouth daily.  , Disp: , Rfl:  fish oil-omega-3 fatty acids 1000 MG capsule, Take 1 g by mouth daily.  , Disp: , Rfl: ;  lisinopril-hydrochlorothiazide (PRINZIDE,ZESTORETIC) 20-25 MG per tablet, Take 1 tablet by mouth daily., Disp: 90 tablet, Rfl: 3;  Multiple Vitamin (MULTIVITAMIN) capsule, Take 1 capsule by mouth daily.  , Disp: , Rfl: ;  simvastatin (ZOCOR) 40 MG tablet, Take 40 mg by mouth at bedtime.  , Disp: , Rfl:  DISCONTD: lisinopril-hydrochlorothiazide (PRINZIDE,ZESTORETIC) 20-25 MG per tablet, Take 1 tablet by mouth daily., Disp: 30 tablet, Rfl: 6;  DISCONTD: lisinopril-hydrochlorothiazide (PRINZIDE,ZESTORETIC) 20-25 MG per tablet, Take 1 tablet by mouth daily., Disp: 90 tablet, Rfl: 3  Past Medical History  Diagnosis Date  . HLD (hyperlipidemia)   . HTN (hypertension)   . BPH (benign prostatic hypertrophy)   . ED (erectile dysfunction)   . IBS (irritable bowel syndrome)   . Osteoarthritis     Past Surgical History  Procedure Date  . Nasal "growth" 2000  . US echocardiography 06/2005    LV EF normal, Mild LVH, LAE    Family History  Problem Relation Age of Onset  . Cancer Father     prostate   . Hyperlipidemia Father   . Arrhythmia Father   . Kidney failure Mother   . Heart failure Mother   . Gout Mother     History   Social History  . Marital Status: Married    Spouse Name: N/A    Number of Children: 2  . Years of Education: N/A   Occupational History  . Retired     Company secretary, Animator.Col   Social History Main Topics  . Smoking status: Never Smoker   . Smokeless tobacco: Not on file  . Alcohol Use: Yes     occasional  . Drug Use: Not on file  . Sexually Active: Not on file   Other Topics Concern  . Not on file   Social History Narrative  . No narrative on file   Review of Systems Sleeps okay Appetite is fine Weight stable Bowels okay if he eats grease No anxiety or depression     Objective:   Physical Exam  Constitutional: He is oriented to person, place, and time. He appears well-developed and well-nourished. No distress.  Neck: Normal range of motion. No thyromegaly present.  Cardiovascular: Normal rate, regular rhythm, normal heart sounds and intact distal pulses.  Exam reveals no gallop.   No murmur heard. Pulmonary/Chest: Effort normal and breath sounds normal. No respiratory distress. He has no wheezes. He has no rales.  Abdominal:  Soft. He exhibits no mass. There is no tenderness.  Musculoskeletal: Normal range of motion. He exhibits no edema and no tenderness.       Thick calves  Lymphadenopathy:    He has no cervical adenopathy.  Neurological: He is alert and oriented to person, place, and time. He exhibits normal muscle tone.       Normal strength Presidents-- "Obama, Danae Orleans, another Bush----Clinton" (after prompting) [phone] Recall-- 2/3 after a few minutes  Skin: No rash noted.  Psychiatric: He has a normal mood and affect. His behavior is normal. Judgment and thought content normal.          Assessment & Plan: I have personally reviewed the Medicare Annual Wellness questionnaire and have noted 1. The patient's medical and  social history 2. Their use of alcohol, tobacco or illicit drugs 3. Their current medications and supplements 4. The patient's functional ability including ADL's, fall risks, home safety risks and hearing or visual             impairment. 5. Diet and physical activities 6. Evidence for depression or mood disorders  The patients weight, height, BMI and visual acuity have been recorded in the chart I have made referrals, counseling and provided education to the patient based review of the above and I have provided the pt with a written personalized care plan for preventive services.  I have provided you with a copy of your personalized plan for preventive services. Please take the time to review along with your updated medication list.    I have personally reviewed the Medicare Annual Wellness questionnaire and have noted 1. The patient's medical and social history 2. Their use of alcohol, tobacco or illicit drugs 3. Their current medications and supplements 4. The patient's functional ability including ADL's, fall risks, home safety risks and hearing or visual             impairment. 5. Diet and physical activities 6. Evidence for depression or mood disorders  The patients weight, height, BMI and visual acuity have been recorded in the chart I have made referrals, counseling and provided education to the patient based review of the above and I have provided the pt with a written personalized care plan for preventive services.  I have provided you with a copy of your personalized plan for preventive services. Please take the time to review along with your updated medication list.

## 2011-04-16 ENCOUNTER — Other Ambulatory Visit: Payer: Self-pay | Admitting: *Deleted

## 2011-04-16 MED ORDER — SIMVASTATIN 40 MG PO TABS: 20.0000 mg | ORAL_TABLET | Freq: Every day | ORAL | Status: DC

## 2011-04-16 MED ORDER — LISINOPRIL-HYDROCHLOROTHIAZIDE 20-25 MG PO TABS: 1.0000 | ORAL_TABLET | Freq: Every day | ORAL | Status: DC

## 2011-04-16 MED ORDER — SIMVASTATIN 40 MG PO TABS: 40.0000 mg | ORAL_TABLET | Freq: Every day | ORAL | Status: DC

## 2011-04-16 MED ORDER — AMLODIPINE BESYLATE 5 MG PO TABS: 5.0000 mg | ORAL_TABLET | Freq: Every day | ORAL | Status: DC

## 2011-04-16 NOTE — Telephone Encounter (Signed)
Pt would like to pick up written 90 day scripts at his wife's appt tomorrow, he mails these in himself.

## 2011-04-16 NOTE — Telephone Encounter (Signed)
Spoke with patient and advised results   

## 2011-04-16 NOTE — Telephone Encounter (Signed)
Please ask him to reduce the simvastatin to 1/2 tab daily as there is a chance for interaction with the amlodipine We can discuss this further at his next visit

## 2011-06-28 ENCOUNTER — Other Ambulatory Visit: Payer: Self-pay | Admitting: Internal Medicine

## 2011-08-21 ENCOUNTER — Encounter: Payer: Self-pay | Admitting: Internal Medicine

## 2011-08-21 ENCOUNTER — Ambulatory Visit (INDEPENDENT_AMBULATORY_CARE_PROVIDER_SITE_OTHER): Payer: Medicare Other | Admitting: Internal Medicine

## 2011-08-21 VITALS — BP 135/72 | HR 69 | Temp 98.4°F | Ht 71.0 in | Wt 216.0 lb

## 2011-08-21 DIAGNOSIS — M1990 Unspecified osteoarthritis, unspecified site: Secondary | ICD-10-CM

## 2011-08-21 DIAGNOSIS — E785 Hyperlipidemia, unspecified: Secondary | ICD-10-CM

## 2011-08-21 DIAGNOSIS — Z23 Encounter for immunization: Secondary | ICD-10-CM

## 2011-08-21 DIAGNOSIS — I10 Essential (primary) hypertension: Secondary | ICD-10-CM

## 2011-08-21 DIAGNOSIS — N400 Enlarged prostate without lower urinary tract symptoms: Secondary | ICD-10-CM

## 2011-08-21 MED ORDER — LISINOPRIL-HYDROCHLOROTHIAZIDE 20-25 MG PO TABS: 1.0000 | ORAL_TABLET | Freq: Every day | ORAL | Status: DC

## 2011-08-21 MED ORDER — DICLOFENAC SODIUM 75 MG PO TBEC: 75.0000 mg | DELAYED_RELEASE_TABLET | Freq: Two times a day (BID) | ORAL | Status: DC | PRN

## 2011-08-21 MED ORDER — AMLODIPINE BESYLATE 5 MG PO TABS: 5.0000 mg | ORAL_TABLET | Freq: Every day | ORAL | Status: DC

## 2011-08-21 NOTE — Assessment & Plan Note (Signed)
Voiding okay Keeps up with Dr Achilles Dunk

## 2011-08-21 NOTE — Assessment & Plan Note (Signed)
Lab Results  Component Value Date   LDLCALC 91 08/08/2010

## 2011-08-21 NOTE — Progress Notes (Signed)
Subjective:    Patient ID: Brendan Barnes, male    DOB: 1941-07-31, 70 y.o.   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 amLODipine (NORVASC) 5 MG tablet TAKE 1 TABLET DAILY FOR HIGH BLOOD PRESSURE  90 tablet  2  . aspirin 81 MG tablet Take 81 mg by mouth daily.        . diclofenac (CATAFLAM) 50 MG tablet Take 50 mg by mouth daily as needed.       . dutasteride (AVODART) 0.5 MG capsule Take 0.5 mg by mouth daily.        . fish oil-omega-3 fatty acids 1000 MG capsule Take 1 g by mouth daily.        Marland Kitchen lisinopril-hydrochlorothiazide (PRINZIDE,ZESTORETIC) 20-25 MG per tablet TAKE 1 TABLET BY MOUTH ONCE A DAY ( GENERIC FOR PRINZIDE 20\25)  90 tablet  2  . Multiple Vitamin (MULTIVITAMIN) capsule Take 1 capsule by mouth daily.        . simvastatin (ZOCOR) 40 MG tablet Take 0.5 tablets (20 mg total) by mouth at bedtime.  45 tablet  3    No Known Allergies  Past Medical History  Diagnosis Date  . HLD (hyperlipidemia)   . HTN (hypertension)   . BPH (benign prostatic hypertrophy)   . ED (erectile dysfunction)   . IBS (irritable bowel syndrome)   . Osteoarthritis     Past Surgical History  Procedure Date  . Nasal "growth" 2000  . US echocardiography 06/2005    LV EF normal, Mild LVH, LAE    Family History  Problem Relation Age of Onset  . Cancer Father     prostate  . Hyperlipidemia Father   . Arrhythmia Father   . Kidney failure Mother   . Heart failure Mother   . Gout Mother     History   Social  History  . Marital Status: Married    Spouse Name: N/A    Number of Children: 2  . Years of Education: N/A   Occupational History  . Retired     Company secretary, Animator.Col   Social History Main Topics  . Smoking status: Never Smoker   . Smokeless tobacco: Not on file  . Alcohol Use: Yes     occasional  . Drug Use: Not on file  . Sexually Active: Not on file   Other Topics Concern  . Not on file   Social History Narrative  . No narrative on file   Review of Systems Sleeps well Weight is stable     Objective:   Physical Exam  Constitutional: He appears well-developed and well-nourished. No distress.  Neck: Normal range of motion. Neck supple. No thyromegaly present.  Cardiovascular: Normal rate, regular rhythm, normal heart sounds and intact distal pulses.  Exam reveals no gallop.   No murmur heard. Pulmonary/Chest: Effort normal and breath sounds normal. No respiratory distress. He has no wheezes. He has no rales.  Abdominal: Soft. There is no tenderness.  Musculoskeletal:  Normal range of motion. He exhibits no edema and no tenderness.  Lymphadenopathy:    He has no cervical adenopathy.  Psychiatric: He has a normal mood and affect. His behavior is normal. Judgment and thought content normal.          Assessment & Plan:

## 2011-08-21 NOTE — Assessment & Plan Note (Signed)
Uses the diclofenac prn Back generally better lately

## 2011-08-21 NOTE — Assessment & Plan Note (Signed)
BP Readings from Last 3 Encounters:  08/21/11 135/72  02/19/11 120/80  08/08/10 140/80   Good control No changes needed Labs next time

## 2011-09-13 DIAGNOSIS — Z0279 Encounter for issue of other medical certificate: Secondary | ICD-10-CM

## 2012-03-03 ENCOUNTER — Encounter: Payer: Self-pay | Admitting: Internal Medicine

## 2012-03-03 ENCOUNTER — Ambulatory Visit (INDEPENDENT_AMBULATORY_CARE_PROVIDER_SITE_OTHER): Payer: Medicare Other | Admitting: Internal Medicine

## 2012-03-03 VITALS — BP 126/80 | HR 67 | Temp 97.7°F | Wt 220.0 lb

## 2012-03-03 DIAGNOSIS — M1990 Unspecified osteoarthritis, unspecified site: Secondary | ICD-10-CM

## 2012-03-03 DIAGNOSIS — I10 Essential (primary) hypertension: Secondary | ICD-10-CM

## 2012-03-03 DIAGNOSIS — N400 Enlarged prostate without lower urinary tract symptoms: Secondary | ICD-10-CM | POA: Diagnosis not present

## 2012-03-03 DIAGNOSIS — E785 Hyperlipidemia, unspecified: Secondary | ICD-10-CM | POA: Diagnosis not present

## 2012-03-03 LAB — CBC WITH DIFFERENTIAL/PLATELET
Basophils Absolute: 0 10*3/uL (ref 0.0–0.1)
Basophils Relative: 0.3 % (ref 0.0–3.0)
Eosinophils Absolute: 0.2 10*3/uL (ref 0.0–0.7)
Hemoglobin: 15 g/dL (ref 13.0–17.0)
Lymphocytes Relative: 28.3 % (ref 12.0–46.0)
MCHC: 33.9 g/dL (ref 30.0–36.0)
Monocytes Relative: 8.6 % (ref 3.0–12.0)
Neutro Abs: 4.8 10*3/uL (ref 1.4–7.7)
Neutrophils Relative %: 59.9 % (ref 43.0–77.0)
RBC: 4.98 Mil/uL (ref 4.22–5.81)

## 2012-03-03 LAB — LIPID PANEL
HDL: 46.6 mg/dL (ref >39.00)
LDL Cholesterol: 75 mg/dL (ref 0–99)
Total CHOL/HDL Ratio: 3
Triglycerides: 194 mg/dL — ABNORMAL HIGH (ref 0.0–149.0)
VLDL: 38.8 mg/dL (ref 0.0–40.0)

## 2012-03-03 LAB — HEPATIC FUNCTION PANEL
ALT: 27 U/L (ref 0–53)
Alkaline Phosphatase: 49 U/L (ref 39–117)
Bilirubin, Direct: 0.1 mg/dL (ref 0.0–0.3)
Total Bilirubin: 1.4 mg/dL — ABNORMAL HIGH (ref 0.3–1.2)

## 2012-03-03 LAB — BASIC METABOLIC PANEL
CO2: 31 mEq/L (ref 19–32)
Calcium: 9.5 mg/dL (ref 8.4–10.5)
Creatinine, Ser: 0.8 mg/dL (ref 0.4–1.5)
GFR: 105.86 mL/min (ref >60.00)
Sodium: 139 mEq/L (ref 135–145)

## 2012-03-03 MED ORDER — DUTASTERIDE 0.5 MG PO CAPS: 0.5000 mg | ORAL_CAPSULE | Freq: Every day | ORAL | Status: DC

## 2012-03-03 MED ORDER — AMLODIPINE BESYLATE 5 MG PO TABS: 5.0000 mg | ORAL_TABLET | Freq: Every day | ORAL | Status: DC

## 2012-03-03 MED ORDER — LISINOPRIL-HYDROCHLOROTHIAZIDE 20-25 MG PO TABS: 1.0000 | ORAL_TABLET | Freq: Every day | ORAL | Status: DC

## 2012-03-03 MED ORDER — SIMVASTATIN 40 MG PO TABS: 20.0000 mg | ORAL_TABLET | Freq: Every day | ORAL | Status: DC

## 2012-03-03 NOTE — Assessment & Plan Note (Signed)
BP Readings from Last 3 Encounters:  03/03/12 126/80  08/21/11 135/72  02/19/11 120/80   Good control Due for labs

## 2012-03-03 NOTE — Progress Notes (Signed)
Subjective:    Patient ID: Brendan Barnes, male    DOB: [birth year], 71 y.o.   MRN: [id number]  HPI Doing well in general  Has noted some tiny red spots that are itchy--back, arm, leg Only a problem when he touches them Non worrisome tiny red macules  Moved into the [facility] at [employer] Has been enjoying this Hasn't been that active---plans to look for activities to keep him active Plans to get assessment of abilities at [facility] to start formal exercise program Does have ongoing knee pain--uses the diclofenac prn  No chest pain No SOB  no edema No dizziness or syncope   No trouble voiding Nocturia is gone Takes the avodart twice a week Sees Dr Achilles Dunk  Current Outpatient Prescriptions on File Prior to Visit  Medication Sig Dispense Refill  . amLODipine (NORVASC) 5 MG tablet Take 1 tablet (5 mg total) by mouth daily.  90 tablet  3  . aspirin 81 MG tablet Take 81 mg by mouth daily.        . diclofenac (VOLTAREN) 75 MG EC tablet Take 1 tablet (75 mg total) by mouth 2 (two) times daily as needed.  180 tablet  3  . dutasteride (AVODART) 0.5 MG capsule Take 0.5 mg by mouth daily.        . fish oil-omega-3 fatty acids 1000 MG capsule Take 1 g by mouth daily.        Marland Kitchen lisinopril-hydrochlorothiazide (PRINZIDE,ZESTORETIC) 20-25 MG per tablet Take 1 tablet by mouth daily.  90 tablet  3  . Multiple Vitamin (MULTIVITAMIN) capsule Take 1 capsule by mouth daily.        Marland Kitchen POTASSIUM PO Take 1 tablet by mouth daily.        . simvastatin (ZOCOR) 40 MG tablet Take 0.5 tablets (20 mg total) by mouth at bedtime.  45 tablet  3    No Known Allergies  Past Medical History  Diagnosis Date  . HLD (hyperlipidemia)   . HTN (hypertension)   . BPH (benign prostatic hypertrophy)   . ED (erectile dysfunction)   . IBS (irritable bowel syndrome)   . Osteoarthritis     Past Surgical History  Procedure Date  . Nasal "growth" 2000  . US echocardiography 06/2005    LV EF normal, Mild LVH, LAE      Family History  Problem Relation Age of Onset  . Cancer Father     prostate  . Hyperlipidemia Father   . Arrhythmia Father   . Kidney failure Mother   . Heart failure Mother   . Gout Mother     History   Social History  . Marital Status: Married    Spouse Name: N/A    Number of Children: 2  . Years of Education: N/A   Occupational History  . Retired     Company secretary, Animator.Col   Social History Main Topics  . Smoking status: Never Smoker   . Smokeless tobacco: Never Used  . Alcohol Use: Yes     occasional  . Drug Use: Not on file  . Sexually Active: Not on file   Other Topics Concern  . Not on file   Social History Narrative  . No narrative on file   Review of Systems Recent trip to [place]--thinks this is why his weight is up 4# Sleeps well    Objective:   Physical Exam  Constitutional: He appears well-developed and well-nourished. No distress.  HENT:  No cerumen in canal  Neck: Normal range of motion. Neck supple. No thyromegaly present.  Cardiovascular: Normal rate, regular rhythm, normal heart sounds and intact distal pulses.  Exam reveals no gallop.   No murmur heard. Pulmonary/Chest: Effort normal and breath sounds normal. No respiratory distress. He has no wheezes. He has no rales.  Abdominal: Soft. There is no tenderness.  Musculoskeletal: Normal range of motion. He exhibits no edema and no tenderness.  Lymphadenopathy:    He has no cervical adenopathy.  Skin: No rash noted. No erythema.  Psychiatric: He has a normal mood and affect. His behavior is normal.          Assessment & Plan:

## 2012-03-03 NOTE — Assessment & Plan Note (Signed)
No problems with the med Will check labs 

## 2012-03-03 NOTE — Assessment & Plan Note (Signed)
Mild problems Rarely needs the diclofenac

## 2012-03-03 NOTE — Assessment & Plan Note (Signed)
Controlled with avodart No changes needed

## 2012-03-05 ENCOUNTER — Encounter: Payer: Self-pay | Admitting: *Deleted

## 2012-03-09 ENCOUNTER — Telehealth: Payer: Self-pay

## 2012-03-09 NOTE — Telephone Encounter (Signed)
Pt called to get 03/03/12 lab results. I explained a letter had been mailed to pt with results and instructions. I reviewed instructions with pt and he said he has been taking OTC potassium 99 mg for some time(since the last time his Potassium was low - pt could not remember exact date).  Pt wants to know Dr Vassie Moselle instruction.  Pt has not scheduled lab appt yet until see what Dr Karle Starch instruction is. Pt can be reached at [phone] and uses [facility] if needed.

## 2012-03-09 NOTE — Telephone Encounter (Signed)
Please have him take 2 of the potassium supplement daily as I indicated If his repeat potassium is normal, we will continue that dose

## 2012-03-10 NOTE — Telephone Encounter (Signed)
Spoke with patient and advised results   

## 2012-04-06 ENCOUNTER — Other Ambulatory Visit: Payer: Self-pay | Admitting: Internal Medicine

## 2012-04-06 DIAGNOSIS — E876 Hypokalemia: Secondary | ICD-10-CM

## 2012-04-07 ENCOUNTER — Other Ambulatory Visit (INDEPENDENT_AMBULATORY_CARE_PROVIDER_SITE_OTHER): Payer: Medicare Other

## 2012-04-07 DIAGNOSIS — E876 Hypokalemia: Secondary | ICD-10-CM | POA: Diagnosis not present

## 2012-04-09 ENCOUNTER — Other Ambulatory Visit: Payer: Self-pay | Admitting: *Deleted

## 2012-04-09 MED ORDER — POTASSIUM CHLORIDE 20 MEQ PO PACK: 20.0000 meq | PACK | Freq: Two times a day (BID) | ORAL | Status: DC

## 2012-04-24 ENCOUNTER — Encounter: Payer: Self-pay | Admitting: *Deleted

## 2012-04-24 ENCOUNTER — Other Ambulatory Visit (INDEPENDENT_AMBULATORY_CARE_PROVIDER_SITE_OTHER): Payer: Medicare Other

## 2012-04-24 DIAGNOSIS — E876 Hypokalemia: Secondary | ICD-10-CM | POA: Diagnosis not present

## 2012-04-24 MED ORDER — POTASSIUM CHLORIDE 20 MEQ PO PACK: 20.0000 meq | PACK | Freq: Two times a day (BID) | ORAL | Status: DC

## 2012-05-11 ENCOUNTER — Other Ambulatory Visit: Payer: Self-pay

## 2012-05-11 MED ORDER — POTASSIUM CHLORIDE 20 MEQ PO PACK: 20.0000 meq | PACK | Freq: Two times a day (BID) | ORAL | Status: DC

## 2012-05-11 NOTE — Telephone Encounter (Signed)
Pt said [facility] did not get Potassium rx. I spoke with Ed Bond at [facility] and gave verbal order. Pt notified while on phone.

## 2012-08-11 DIAGNOSIS — Z23 Encounter for immunization: Secondary | ICD-10-CM | POA: Diagnosis not present

## 2012-09-01 DIAGNOSIS — R339 Retention of urine, unspecified: Secondary | ICD-10-CM | POA: Diagnosis not present

## 2012-09-01 DIAGNOSIS — N138 Other obstructive and reflux uropathy: Secondary | ICD-10-CM | POA: Diagnosis not present

## 2012-09-01 DIAGNOSIS — Z8042 Family history of malignant neoplasm of prostate: Secondary | ICD-10-CM | POA: Insufficient documentation

## 2012-09-01 DIAGNOSIS — N403 Nodular prostate with lower urinary tract symptoms: Secondary | ICD-10-CM | POA: Diagnosis not present

## 2012-11-30 DIAGNOSIS — L570 Actinic keratosis: Secondary | ICD-10-CM | POA: Diagnosis not present

## 2012-11-30 DIAGNOSIS — L821 Other seborrheic keratosis: Secondary | ICD-10-CM | POA: Diagnosis not present

## 2012-11-30 DIAGNOSIS — L280 Lichen simplex chronicus: Secondary | ICD-10-CM | POA: Diagnosis not present

## 2012-11-30 DIAGNOSIS — D0470 Carcinoma in situ of skin of unspecified lower limb, including hip: Secondary | ICD-10-CM | POA: Diagnosis not present

## 2012-11-30 DIAGNOSIS — D485 Neoplasm of uncertain behavior of skin: Secondary | ICD-10-CM | POA: Diagnosis not present

## 2012-11-30 DIAGNOSIS — L219 Seborrheic dermatitis, unspecified: Secondary | ICD-10-CM | POA: Diagnosis not present

## 2012-11-30 DIAGNOSIS — L738 Other specified follicular disorders: Secondary | ICD-10-CM | POA: Diagnosis not present

## 2012-12-08 ENCOUNTER — Other Ambulatory Visit: Payer: Self-pay | Admitting: *Deleted

## 2012-12-08 MED ORDER — POTASSIUM CHLORIDE CRYS ER 20 MEQ PO TBCR: 20.0000 meq | EXTENDED_RELEASE_TABLET | Freq: Two times a day (BID) | ORAL | Status: DC

## 2012-12-08 MED ORDER — POTASSIUM CHLORIDE 20 MEQ PO PACK: 20.0000 meq | PACK | Freq: Two times a day (BID) | ORAL | Status: DC

## 2012-12-08 NOTE — Addendum Note (Signed)
Addended by: Sueanne Margarita on: 12/08/2012 04:00 PM   Modules accepted: Orders

## 2012-12-08 NOTE — Telephone Encounter (Signed)
Pharmacist at [facility] called and stated pt wanted to try the tablets. rx sent to pharmacy by e-script

## 2012-12-28 DIAGNOSIS — L299 Pruritus, unspecified: Secondary | ICD-10-CM | POA: Diagnosis not present

## 2012-12-28 DIAGNOSIS — L738 Other specified follicular disorders: Secondary | ICD-10-CM | POA: Diagnosis not present

## 2012-12-28 DIAGNOSIS — D0470 Carcinoma in situ of skin of unspecified lower limb, including hip: Secondary | ICD-10-CM | POA: Diagnosis not present

## 2013-01-05 ENCOUNTER — Other Ambulatory Visit: Payer: Self-pay | Admitting: Internal Medicine

## 2013-01-25 DIAGNOSIS — L989 Disorder of the skin and subcutaneous tissue, unspecified: Secondary | ICD-10-CM | POA: Diagnosis not present

## 2013-01-25 DIAGNOSIS — D0470 Carcinoma in situ of skin of unspecified lower limb, including hip: Secondary | ICD-10-CM | POA: Diagnosis not present

## 2013-01-25 DIAGNOSIS — L905 Scar conditions and fibrosis of skin: Secondary | ICD-10-CM | POA: Diagnosis not present

## 2013-01-25 DIAGNOSIS — L570 Actinic keratosis: Secondary | ICD-10-CM | POA: Diagnosis not present

## 2013-02-03 ENCOUNTER — Other Ambulatory Visit: Payer: Self-pay | Admitting: Internal Medicine

## 2013-02-03 NOTE — Telephone Encounter (Signed)
rx sent to pharmacy by e-script  

## 2013-03-15 ENCOUNTER — Other Ambulatory Visit: Payer: Self-pay | Admitting: Internal Medicine

## 2013-04-12 DIAGNOSIS — H2510 Age-related nuclear cataract, unspecified eye: Secondary | ICD-10-CM | POA: Diagnosis not present

## 2013-07-01 ENCOUNTER — Other Ambulatory Visit: Payer: Self-pay | Admitting: *Deleted

## 2013-07-01 NOTE — Telephone Encounter (Signed)
Refilled denied sent back to the pharmacy

## 2013-07-01 NOTE — Telephone Encounter (Signed)
Okay to refill #180 x 0 We should discuss this at his next OV and recheck kidney tests when he is taking it again

## 2013-07-01 NOTE — Telephone Encounter (Signed)
Not seen in over a year He needs appt within 2-3 months

## 2013-07-01 NOTE — Telephone Encounter (Signed)
Last filled 08/22/2011

## 2013-07-06 ENCOUNTER — Other Ambulatory Visit: Payer: Self-pay | Admitting: Internal Medicine

## 2013-07-07 ENCOUNTER — Ambulatory Visit (INDEPENDENT_AMBULATORY_CARE_PROVIDER_SITE_OTHER): Payer: Medicare Other | Admitting: Internal Medicine

## 2013-07-07 ENCOUNTER — Encounter: Payer: Self-pay | Admitting: Internal Medicine

## 2013-07-07 VITALS — BP 134/88 | HR 77 | Temp 97.9°F | Wt 227.8 lb

## 2013-07-07 DIAGNOSIS — E785 Hyperlipidemia, unspecified: Secondary | ICD-10-CM

## 2013-07-07 DIAGNOSIS — N400 Enlarged prostate without lower urinary tract symptoms: Secondary | ICD-10-CM

## 2013-07-07 DIAGNOSIS — Z23 Encounter for immunization: Secondary | ICD-10-CM

## 2013-07-07 DIAGNOSIS — I10 Essential (primary) hypertension: Secondary | ICD-10-CM

## 2013-07-07 DIAGNOSIS — M479 Spondylosis, unspecified: Secondary | ICD-10-CM

## 2013-07-07 DIAGNOSIS — M47815 Spondylosis without myelopathy or radiculopathy, thoracolumbar region: Secondary | ICD-10-CM

## 2013-07-07 LAB — CBC WITH DIFFERENTIAL/PLATELET
Basophils Absolute: 0 10*3/uL (ref 0.0–0.1)
Eosinophils Relative: 2.6 % (ref 0.0–5.0)
MCV: 87.4 fl (ref 78.0–100.0)
Monocytes Absolute: 0.7 10*3/uL (ref 0.1–1.0)
Monocytes Relative: 8.6 % (ref 3.0–12.0)
Neutrophils Relative %: 63.5 % (ref 43.0–77.0)
Platelets: 210 10*3/uL (ref 150.0–400.0)
WBC: 8.7 10*3/uL (ref 4.5–10.5)

## 2013-07-07 LAB — LIPID PANEL
Cholesterol: 166 mg/dL (ref 0–200)
HDL: 40 mg/dL (ref >39.00)
VLDL: 51.6 mg/dL — ABNORMAL HIGH (ref 0.0–40.0)

## 2013-07-07 LAB — BASIC METABOLIC PANEL
BUN: 13 mg/dL (ref 6–23)
Creatinine, Ser: 0.9 mg/dL (ref 0.4–1.5)
GFR: 85.88 mL/min (ref >60.00)

## 2013-07-07 LAB — TSH: TSH: 1.26 u[IU]/mL (ref 0.35–5.50)

## 2013-07-07 LAB — HEPATIC FUNCTION PANEL
AST: 29 U/L (ref 0–37)
Total Bilirubin: 1.2 mg/dL (ref 0.3–1.2)

## 2013-07-07 MED ORDER — DICLOFENAC SODIUM 75 MG PO TBEC: 75.0000 mg | DELAYED_RELEASE_TABLET | Freq: Two times a day (BID) | ORAL | Status: DC | PRN

## 2013-07-07 MED ORDER — DUTASTERIDE 0.5 MG PO CAPS: 0.5000 mg | ORAL_CAPSULE | Freq: Every day | ORAL | Status: DC

## 2013-07-07 NOTE — Patient Instructions (Signed)
DASH Diet  The DASH diet stands for "Dietary Approaches to Stop Hypertension." It is a healthy eating plan that has been shown to reduce high blood pressure (hypertension) in as little as 14 days, while also possibly providing other significant health benefits. These other health benefits include reducing the risk of breast cancer after menopause and reducing the risk of type 2 diabetes, heart disease, colon cancer, and stroke. Health benefits also include weight loss and slowing kidney failure in patients with chronic kidney disease.   DIET GUIDELINES  · Limit salt (sodium). Your diet should contain less than 1500 mg of sodium daily.  · Limit refined or processed carbohydrates. Your diet should include mostly whole grains. Desserts and added sugars should be used sparingly.  · Include small amounts of heart-healthy fats. These types of fats include nuts, oils, and tub margarine. Limit saturated and trans fats. These fats have been shown to be harmful in the body.  CHOOSING FOODS   The following food groups are based on a 2000 calorie diet. See your Registered Dietitian for individual calorie needs.  Grains and Grain Products (6 to 8 servings daily)  · Eat More Often: Whole-wheat bread, brown rice, whole-grain or wheat pasta, quinoa, popcorn without added fat or salt (air popped).  · Eat Less Often: White bread, white pasta, white rice, cornbread.  Vegetables (4 to 5 servings daily)  · Eat More Often: Fresh, frozen, and canned vegetables. Vegetables may be raw, steamed, roasted, or grilled with a minimal amount of fat.  · Eat Less Often/Avoid: Creamed or fried vegetables. Vegetables in a cheese sauce.  Fruit (4 to 5 servings daily)  · Eat More Often: All fresh, canned (in natural juice), or frozen fruits. Dried fruits without added sugar. One hundred percent fruit juice (½ cup [237 mL] daily).  · Eat Less Often: Dried fruits with added sugar. Canned fruit in light or heavy syrup.  Lean Meats, Fish, and Poultry (2  servings or less daily. One serving is 3 to 4 oz [85-114 g]).  · Eat More Often: Ninety percent or leaner ground beef, tenderloin, sirloin. Round cuts of beef, chicken breast, turkey breast. All fish. Grill, bake, or broil your meat. Nothing should be fried.  · Eat Less Often/Avoid: Fatty cuts of meat, turkey, or chicken leg, thigh, or wing. Fried cuts of meat or fish.  Dairy (2 to 3 servings)  · Eat More Often: Low-fat or fat-free milk, low-fat plain or light yogurt, reduced-fat or part-skim cheese.  · Eat Less Often/Avoid: Milk (whole, 2%). Whole milk yogurt. Full-fat cheeses.  Nuts, Seeds, and Legumes (4 to 5 servings per week)  · Eat More Often: All without added salt.  · Eat Less Often/Avoid: Salted nuts and seeds, canned beans with added salt.  Fats and Sweets (limited)  · Eat More Often: Vegetable oils, tub margarines without trans fats, sugar-free gelatin. Mayonnaise and salad dressings.  · Eat Less Often/Avoid: Coconut oils, palm oils, butter, stick margarine, cream, half and half, cookies, candy, pie.  FOR MORE INFORMATION  The Dash Diet Eating Plan: www.dashdiet.org  Document Released: 10/10/2011 Document Revised: 01/13/2012 Document Reviewed: 10/10/2011  ExitCare® Patient Information ©2014 [employer].

## 2013-07-07 NOTE — Progress Notes (Signed)
Subjective:    Patient ID: Brendan Barnes, male    DOB: 1941-05-28, 72 y.o.   MRN: [id number]  HPI Here for check up Reviewed advanced directives  Still enjoying living at [facility] at [facility] to do some exercise and pulled his groin  Doesn't check BP No headaches  No chest pain No dizziness or syncope No edema  Voids okay No major dribbling or urgency Tries to drink lots of water Nocturia x 1 only  Gets occ back pain Uses the voltaren prn---not that often No other major arthritis pain  Current Outpatient Prescriptions on File Prior to Visit  Medication Sig Dispense Refill  . amLODipine (NORVASC) 5 MG tablet TAKE 1 TABLET DAILY  90 tablet  3  . aspirin 81 MG tablet Take 81 mg by mouth daily.        . diclofenac (VOLTAREN) 75 MG EC tablet Take 1 tablet (75 mg total) by mouth 2 (two) times daily as needed.  180 tablet  3  . dutasteride (AVODART) 0.5 MG capsule Take 1 capsule (0.5 mg total) by mouth daily.  90 capsule  3  . fish oil-omega-3 fatty acids 1000 MG capsule Take 1 g by mouth daily.        Marland Kitchen lisinopril-hydrochlorothiazide (PRINZIDE,ZESTORETIC) 20-25 MG per tablet TAKE 1 TABLET DAILY  90 tablet  0  . Multiple Vitamin (MULTIVITAMIN) capsule Take 1 capsule by mouth daily.        . potassium chloride SA (K-DUR,KLOR-CON) 20 MEQ tablet Take 1 tablet (20 mEq total) by mouth 2 (two) times daily.  60 tablet  11  . simvastatin (ZOCOR) 40 MG tablet TAKE ONE-HALF (1/2) TABLET AT BEDTIME  45 tablet  3   No current facility-administered medications on file prior to visit.    No Known Allergies  Past Medical History  Diagnosis Date  . HLD (hyperlipidemia)   . HTN (hypertension)   . BPH (benign prostatic hypertrophy)   . ED (erectile dysfunction)   . IBS (irritable bowel syndrome)   . Osteoarthritis     Past Surgical History  Procedure Laterality Date  . Nasal "growth"  2000  . US echocardiography  06/2005    LV EF normal, Mild LVH, LAE    Family History   Problem Relation Age of Onset  . Cancer Father     prostate  . Hyperlipidemia Father   . Arrhythmia Father   . Kidney failure Mother   . Heart failure Mother   . Gout Mother     History   Social History  . Marital Status: Married    Spouse Name: N/A    Number of Children: 2  . Years of Education: N/A   Occupational History  . Retired     Company secretary, [email or web address][employer]   Social History Main Topics  . Smoking status: Never Smoker   . Smokeless tobacco: Never Used  . Alcohol Use: Yes     Comment: occasional  . Drug Use: Not on file  . Sexual Activity: Not on file   Other Topics Concern  . Not on file   Social History Narrative   Has living will   Wife [place] Sink is his health care POA   Would accept resuscitation attempts   Might accept tube feeds based on situation   Review of Systems Does have a a month contract---eats well Weight is up 7#--discussed Sleeping well    Objective:   Physical Exam  Constitutional: He appears well-nourished. No distress.  Neck:  Normal range of motion. Neck supple. No thyromegaly present.  Cardiovascular: Normal rate, regular rhythm, normal heart sounds and intact distal pulses.  Exam reveals no gallop.   No murmur heard. Pulmonary/Chest: Effort normal and breath sounds normal. No respiratory distress. He has no wheezes. He has no rales.  Abdominal: Soft. There is no tenderness.  Musculoskeletal: He exhibits no edema and no tenderness.  Lymphadenopathy:    He has no cervical adenopathy.  Psychiatric: He has a normal mood and affect. His behavior is normal.          Assessment & Plan:

## 2013-07-07 NOTE — Assessment & Plan Note (Signed)
Discussed exercise Uses the diclofenac occasionally

## 2013-07-07 NOTE — Addendum Note (Signed)
Addended by: Sueanne Margarita on: 07/07/2013 10:55 AM   Modules accepted: Orders

## 2013-07-07 NOTE — Assessment & Plan Note (Signed)
Voids okay on the avodart Will continue

## 2013-07-07 NOTE — Assessment & Plan Note (Signed)
BP Readings from Last 3 Encounters:  07/07/13 134/88  03/03/12 126/80  08/21/11 135/72   Good control Due for labs

## 2013-07-07 NOTE — Assessment & Plan Note (Signed)
Discussed primary prevention with statin He wishes to continue

## 2013-07-10 ENCOUNTER — Encounter: Payer: Self-pay | Admitting: Internal Medicine

## 2013-07-20 ENCOUNTER — Encounter: Payer: Self-pay | Admitting: *Deleted

## 2013-07-21 ENCOUNTER — Other Ambulatory Visit (INDEPENDENT_AMBULATORY_CARE_PROVIDER_SITE_OTHER): Payer: Medicare Other

## 2013-07-21 DIAGNOSIS — R7309 Other abnormal glucose: Secondary | ICD-10-CM

## 2013-07-27 ENCOUNTER — Other Ambulatory Visit: Payer: Self-pay

## 2013-07-27 MED ORDER — LISINOPRIL-HYDROCHLOROTHIAZIDE 20-25 MG PO TABS: ORAL_TABLET | ORAL | Status: DC

## 2013-07-27 MED ORDER — POTASSIUM CHLORIDE CRYS ER 20 MEQ PO TBCR: 20.0000 meq | EXTENDED_RELEASE_TABLET | Freq: Two times a day (BID) | ORAL | Status: DC

## 2013-07-27 NOTE — Telephone Encounter (Signed)
Spoke with patient and advised rx ready for pick-up and it will be at the front desk.  

## 2013-07-27 NOTE — Telephone Encounter (Signed)
Pt said [facility] no longer has lisinopril HCTZ 20-25 and pt request to pick up rx on 07/28/13. Pt said he does not know what local pharmacy he will be using that is reason for written rx. Call pt when rx ready for pick up.

## 2013-09-08 DIAGNOSIS — R339 Retention of urine, unspecified: Secondary | ICD-10-CM | POA: Diagnosis not present

## 2013-09-08 DIAGNOSIS — N138 Other obstructive and reflux uropathy: Secondary | ICD-10-CM | POA: Diagnosis not present

## 2013-09-08 DIAGNOSIS — Z8042 Family history of malignant neoplasm of prostate: Secondary | ICD-10-CM | POA: Diagnosis not present

## 2013-09-08 DIAGNOSIS — N529 Male erectile dysfunction, unspecified: Secondary | ICD-10-CM | POA: Diagnosis not present

## 2013-12-06 ENCOUNTER — Other Ambulatory Visit: Payer: Self-pay | Admitting: Internal Medicine

## 2014-01-10 ENCOUNTER — Inpatient Hospital Stay: Payer: Self-pay | Admitting: Internal Medicine

## 2014-01-10 DIAGNOSIS — E876 Hypokalemia: Secondary | ICD-10-CM | POA: Diagnosis not present

## 2014-01-10 DIAGNOSIS — R112 Nausea with vomiting, unspecified: Secondary | ICD-10-CM | POA: Diagnosis not present

## 2014-01-10 DIAGNOSIS — I959 Hypotension, unspecified: Secondary | ICD-10-CM | POA: Diagnosis not present

## 2014-01-10 DIAGNOSIS — E785 Hyperlipidemia, unspecified: Secondary | ICD-10-CM | POA: Diagnosis not present

## 2014-01-10 DIAGNOSIS — N400 Enlarged prostate without lower urinary tract symptoms: Secondary | ICD-10-CM | POA: Diagnosis not present

## 2014-01-10 DIAGNOSIS — R109 Unspecified abdominal pain: Secondary | ICD-10-CM | POA: Diagnosis not present

## 2014-01-10 DIAGNOSIS — I10 Essential (primary) hypertension: Secondary | ICD-10-CM | POA: Diagnosis not present

## 2014-01-10 DIAGNOSIS — R197 Diarrhea, unspecified: Secondary | ICD-10-CM | POA: Diagnosis not present

## 2014-01-10 DIAGNOSIS — R5381 Other malaise: Secondary | ICD-10-CM | POA: Diagnosis not present

## 2014-01-10 DIAGNOSIS — E860 Dehydration: Secondary | ICD-10-CM | POA: Diagnosis not present

## 2014-01-10 DIAGNOSIS — R5383 Other fatigue: Secondary | ICD-10-CM | POA: Diagnosis not present

## 2014-01-10 DIAGNOSIS — I059 Rheumatic mitral valve disease, unspecified: Secondary | ICD-10-CM | POA: Diagnosis not present

## 2014-01-10 LAB — CBC WITH DIFFERENTIAL/PLATELET
BASOS PCT: 0.2 %
Basophil #: 0 10*3/uL (ref 0.0–0.1)
Eosinophil #: 0 10*3/uL (ref 0.0–0.7)
Eosinophil %: 0 %
HCT: 41.8 % (ref 40.0–52.0)
HGB: 14.7 g/dL (ref 13.0–18.0)
LYMPHS PCT: 2.4 %
Lymphocyte #: 0.3 10*3/uL — ABNORMAL LOW (ref 1.0–3.6)
MCH: 30.4 pg (ref 26.0–34.0)
MCHC: 35.1 g/dL (ref 32.0–36.0)
MCV: 87 fL (ref 80–100)
Monocyte #: 0.4 x10 3/mm (ref 0.2–1.0)
Monocyte %: 3.6 %
Neutrophil #: 10.1 10*3/uL — ABNORMAL HIGH (ref 1.4–6.5)
Neutrophil %: 93.8 %
Platelet: 163 10*3/uL (ref 150–440)
RBC: 4.83 10*6/uL (ref 4.40–5.90)
RDW: 13.5 % (ref 11.5–14.5)
WBC: 10.7 10*3/uL — AB (ref 3.8–10.6)

## 2014-01-10 LAB — COMPREHENSIVE METABOLIC PANEL
ALK PHOS: 51 U/L
AST: 20 U/L (ref 15–37)
Albumin: 3.2 g/dL — ABNORMAL LOW (ref 3.4–5.0)
Anion Gap: 7 (ref 7–16)
BUN: 25 mg/dL — AB (ref 7–18)
Bilirubin,Total: 1.7 mg/dL — ABNORMAL HIGH (ref 0.2–1.0)
CALCIUM: 8.8 mg/dL (ref 8.5–10.1)
CHLORIDE: 104 mmol/L (ref 98–107)
Co2: 28 mmol/L (ref 21–32)
Creatinine: 1.54 mg/dL — ABNORMAL HIGH (ref 0.60–1.30)
EGFR (Non-African Amer.): 44 — ABNORMAL LOW
GFR CALC AF AMER: 51 — AB
GLUCOSE: 153 mg/dL — AB (ref 65–99)
OSMOLALITY: 285 (ref 275–301)
Potassium: 3.2 mmol/L — ABNORMAL LOW (ref 3.5–5.1)
SGPT (ALT): 34 U/L (ref 12–78)
Sodium: 139 mmol/L (ref 136–145)
TOTAL PROTEIN: 6.4 g/dL (ref 6.4–8.2)

## 2014-01-10 LAB — CK-MB: CK-MB: 1.3 ng/mL (ref 0.5–3.6)

## 2014-01-10 LAB — TROPONIN I

## 2014-01-11 ENCOUNTER — Telehealth: Payer: Self-pay | Admitting: Internal Medicine

## 2014-01-11 DIAGNOSIS — I059 Rheumatic mitral valve disease, unspecified: Secondary | ICD-10-CM

## 2014-01-11 LAB — CBC WITH DIFFERENTIAL/PLATELET
Basophil #: 0 10*3/uL (ref 0.0–0.1)
Basophil %: 0.1 %
EOS ABS: 0 10*3/uL (ref 0.0–0.7)
Eosinophil %: 0 %
HCT: 37.4 % — ABNORMAL LOW (ref 40.0–52.0)
HGB: 13.4 g/dL (ref 13.0–18.0)
Lymphocyte #: 0.3 10*3/uL — ABNORMAL LOW (ref 1.0–3.6)
Lymphocyte %: 3.4 %
MCH: 30.5 pg (ref 26.0–34.0)
MCHC: 35.7 g/dL (ref 32.0–36.0)
MCV: 86 fL (ref 80–100)
MONO ABS: 0.4 x10 3/mm (ref 0.2–1.0)
Monocyte %: 4.6 %
NEUTROS PCT: 91.9 %
Neutrophil #: 8 10*3/uL — ABNORMAL HIGH (ref 1.4–6.5)
Platelet: 166 10*3/uL (ref 150–440)
RBC: 4.38 10*6/uL — ABNORMAL LOW (ref 4.40–5.90)
RDW: 13.5 % (ref 11.5–14.5)
WBC: 8.7 10*3/uL (ref 3.8–10.6)

## 2014-01-11 LAB — CK-MB
CK-MB: 1.3 ng/mL (ref 0.5–3.6)
CK-MB: 1.4 ng/mL (ref 0.5–3.6)

## 2014-01-11 LAB — BASIC METABOLIC PANEL
ANION GAP: 7 (ref 7–16)
BUN: 27 mg/dL — AB (ref 7–18)
CALCIUM: 7.8 mg/dL — AB (ref 8.5–10.1)
CHLORIDE: 106 mmol/L (ref 98–107)
CO2: 27 mmol/L (ref 21–32)
Creatinine: 1.21 mg/dL (ref 0.60–1.30)
EGFR (Non-African Amer.): 59 — ABNORMAL LOW
GLUCOSE: 129 mg/dL — AB (ref 65–99)
OSMOLALITY: 286 (ref 275–301)
Potassium: 3.4 mmol/L — ABNORMAL LOW (ref 3.5–5.1)
SODIUM: 140 mmol/L (ref 136–145)

## 2014-01-11 LAB — TROPONIN I: Troponin-I: <0.02 ng/mL

## 2014-01-11 LAB — OCCULT BLOOD X 1 CARD TO LAB, STOOL: Occult Blood, Feces: POSITIVE

## 2014-01-11 LAB — CLOSTRIDIUM DIFFICILE(ARMC)

## 2014-01-11 NOTE — Telephone Encounter (Signed)
Dr. Silvio Barnes this patient was just released from the hospital.  Brendan Barnes from the hospital made him an appt for 01/21/14 but his wife said they want him to be seen this week.  You are off Thursday and Friday and there are no open appointments tomorrow.  Please advise if the 10:00 blocked appt can be used.  Thank you.

## 2014-01-12 NOTE — Telephone Encounter (Signed)
He had diarrhea and fainting type spell.  I spoke to him yesterday and he sounded fine Can add him in on Monday  No, 10AM cannot be used

## 2014-01-13 LAB — WBCS, STOOL

## 2014-01-14 LAB — STOOL CULTURE

## 2014-01-17 ENCOUNTER — Ambulatory Visit (INDEPENDENT_AMBULATORY_CARE_PROVIDER_SITE_OTHER): Payer: Medicare Other | Admitting: Internal Medicine

## 2014-01-17 ENCOUNTER — Encounter: Payer: Self-pay | Admitting: Internal Medicine

## 2014-01-17 VITALS — BP 146/90 | HR 72 | Temp 97.7°F | Wt 215.2 lb

## 2014-01-17 DIAGNOSIS — N400 Enlarged prostate without lower urinary tract symptoms: Secondary | ICD-10-CM | POA: Diagnosis not present

## 2014-01-17 DIAGNOSIS — R55 Syncope and collapse: Secondary | ICD-10-CM

## 2014-01-17 DIAGNOSIS — I10 Essential (primary) hypertension: Secondary | ICD-10-CM | POA: Diagnosis not present

## 2014-01-17 LAB — BASIC METABOLIC PANEL
BUN: 12 mg/dL (ref 6–23)
CHLORIDE: 100 meq/L (ref 96–112)
CO2: 31 meq/L (ref 19–32)
CREATININE: 0.8 mg/dL (ref 0.4–1.5)
Calcium: 9.2 mg/dL (ref 8.4–10.5)
GFR: 105.3 mL/min (ref >60.00)
Glucose, Bld: 86 mg/dL (ref 70–99)
POTASSIUM: 4.1 meq/L (ref 3.5–5.1)
SODIUM: 138 meq/L (ref 135–145)

## 2014-01-17 MED ORDER — LISINOPRIL-HYDROCHLOROTHIAZIDE 20-25 MG PO TABS: ORAL_TABLET | ORAL | Status: DC

## 2014-01-17 NOTE — Assessment & Plan Note (Signed)
Voiding okay Reviewed Dr Bjorn Loser last note

## 2014-01-17 NOTE — Patient Instructions (Signed)
Please restart your blood pressure medications.

## 2014-01-17 NOTE — Progress Notes (Signed)
Pre visit review using our clinic review tool, if applicable. No additional management support is needed unless otherwise documented below in the visit note. 

## 2014-01-17 NOTE — Assessment & Plan Note (Signed)
BP Readings from Last 3 Encounters:  01/17/14 146/90  07/07/13 134/88  03/03/12 126/80   Will have him restart his BP meds Check renal

## 2014-01-17 NOTE — Addendum Note (Signed)
Addended by: Emelia Salisbury C on: 01/17/2014 10:43 AM   Modules accepted: Orders

## 2014-01-17 NOTE — Progress Notes (Signed)
Subjective:    Patient ID: Brendan Barnes, male    DOB: [birth year], 73 y.o.   MRN: [id number]  HPI Got GI bug with diarrhea Had near syncopal spell with noted hypotension Admitted to [facility] overnight---records reviewed  Had spell of diarrhea---then improved Went to bridge that evening--had apparent vagal spell with sweating, dizziness with another spell BP measured low--then sent to ER (vomited on way and got IV antiemetic) No neurologic findings No findings to suggest coronary ischemia---but kept to evaluate. Got echol  No more diarrhea Still some postprandial stomach pains--?gas No fever Appetite is improving  Still hasn't gone back on the BP meds   Current Outpatient Prescriptions on File Prior to Visit  Medication Sig Dispense Refill  . amLODipine (NORVASC) 5 MG tablet TAKE 1 TABLET DAILY  90 tablet  3  . aspirin 81 MG tablet Take 81 mg by mouth daily.        . diclofenac (VOLTAREN) 75 MG EC tablet Take 1 tablet (75 mg total) by mouth 2 (two) times daily as needed.  180 tablet  3  . lisinopril-hydrochlorothiazide (PRINZIDE,ZESTORETIC) 20-25 MG per tablet TAKE 1 TABLET DAILY  90 tablet  3  . Multiple Vitamin (MULTIVITAMIN) capsule Take 1 capsule by mouth daily.        . potassium chloride SA (K-DUR,KLOR-CON) 20 MEQ tablet TAKE 1 TABLET TWO TIMES DAILY  180 tablet  0  . simvastatin (ZOCOR) 40 MG tablet TAKE ONE-HALF (1/2) TABLET AT BEDTIME  45 tablet  3  . fish oil-omega-3 fatty acids 1000 MG capsule Take 1 g by mouth daily.         No current facility-administered medications on file prior to visit.    No Known Allergies  Past Medical History  Diagnosis Date  . HLD (hyperlipidemia)   . HTN (hypertension)   . BPH (benign prostatic hypertrophy)   . ED (erectile dysfunction)   . IBS (irritable bowel syndrome)   . Osteoarthritis     Past Surgical History  Procedure Laterality Date  . Nasal "growth"  2000  . US echocardiography  06/2005    LV EF normal, Mild LVH,  LAE    Family History  Problem Relation Age of Onset  . Cancer Father     prostate  . Hyperlipidemia Father   . Arrhythmia Father   . Kidney failure Mother   . Heart failure Mother   . Gout Mother     History   Social History  . Marital Status: Married    Spouse Name: N/A    Number of Children: 2  . Years of Education: N/A   Occupational History  . 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 Smoking status: Never Smoker   . Smokeless tobacco: Never Used  . Alcohol Use: Yes     Comment: occasional  . Drug Use: No  . Sexual Activity: Not on file   Other Topics Concern  . Not on file   Social History Narrative   Has living will   Wife [name] is his health care POA   Would accept resuscitation attempts   Might accept tube feeds based on situation   Review of Systems Weight is down from last time Generally sleeps okay---no regular nocturia Dr Jacqlyn Larsen told him he had an early left hernia    Objective:   Physical Exam  Constitutional: He appears well-developed and well-nourished. No distress.  Neck: Normal range of motion. Neck supple.  No thyromegaly present.  Cardiovascular: Normal rate, regular rhythm and normal heart sounds.  Exam reveals no gallop.   No murmur heard. Pulmonary/Chest: Effort normal and breath sounds normal. No respiratory distress. He has no wheezes. He has no rales.  Abdominal: Soft. Bowel sounds are normal. He exhibits no distension. There is no tenderness. There is no rebound and no guarding.  Genitourinary:  No clear inguinal hernia  Musculoskeletal:  1+ non pitting edema in calves  Lymphadenopathy:    He has no cervical adenopathy.  Psychiatric: He has a normal mood and affect. His behavior is normal.          Assessment & Plan:

## 2014-01-17 NOTE — Assessment & Plan Note (Signed)
Associated with diarrhea and GI bug Better now

## 2014-01-18 ENCOUNTER — Telehealth: Payer: Self-pay | Admitting: Internal Medicine

## 2014-01-18 NOTE — Telephone Encounter (Signed)
Relevant patient education assigned to patient using Emmi. ° °

## 2014-01-20 ENCOUNTER — Other Ambulatory Visit: Payer: Self-pay | Admitting: Internal Medicine

## 2014-01-21 ENCOUNTER — Ambulatory Visit: Payer: Medicare Other | Admitting: Internal Medicine

## 2014-02-16 ENCOUNTER — Other Ambulatory Visit: Payer: Self-pay | Admitting: Internal Medicine

## 2014-04-12 ENCOUNTER — Other Ambulatory Visit: Payer: Self-pay | Admitting: *Deleted

## 2014-04-12 MED ORDER — SIMVASTATIN 20 MG PO TABS: 20.0000 mg | ORAL_TABLET | Freq: Every day | ORAL | Status: DC

## 2014-06-09 ENCOUNTER — Other Ambulatory Visit: Payer: Self-pay | Admitting: Internal Medicine

## 2014-08-20 ENCOUNTER — Other Ambulatory Visit: Payer: Self-pay | Admitting: Internal Medicine

## 2014-09-10 ENCOUNTER — Other Ambulatory Visit: Payer: Self-pay | Admitting: Internal Medicine

## 2014-09-23 ENCOUNTER — Ambulatory Visit (INDEPENDENT_AMBULATORY_CARE_PROVIDER_SITE_OTHER): Payer: Medicare Other

## 2014-09-23 DIAGNOSIS — Z23 Encounter for immunization: Secondary | ICD-10-CM

## 2014-11-02 ENCOUNTER — Other Ambulatory Visit: Payer: Self-pay | Admitting: Internal Medicine

## 2015-01-03 ENCOUNTER — Other Ambulatory Visit: Payer: Self-pay | Admitting: Internal Medicine

## 2015-01-11 ENCOUNTER — Other Ambulatory Visit: Payer: Self-pay | Admitting: Internal Medicine

## 2015-02-01 ENCOUNTER — Other Ambulatory Visit: Payer: Self-pay | Admitting: Internal Medicine

## 2015-02-25 NOTE — Discharge Summary (Signed)
PATIENT NAME:  Brendan Barnes, COTTEN MR#:  [id number] DATE OF BIRTH:  1941/05/09  DATE OF ADMISSION:  01/10/2014 DATE OF DISCHARGE:  01/11/2014  PRIMARY CARE PHYSICIAN: Viviana Simpler, MD  FINAL DIAGNOSES: 1.  Hypotension.  2.  Diarrhea.  3.  Hypokalemia.  4.  Hyperlipidemia.  5.  Benign prostatic hypertrophy.   MEDICATIONS ON DISCHARGE: Include Zocor 1/2 tablet daily, finasteride 5 mg daily, Klor-Con 20 mEq 1 tablet daily for 7 days.   I told the patient to hold the hydrochlorothiazide/lisinopril, amlodipine, diclofenac, and aspirin at this point.   DIET: Low-sodium diet, regular consistency.   ACTIVITY: As tolerated.   REASON FOR ADMISSION: The patient was admitted 01/10/2014 with hypotension, diarrhea, dizziness, and diaphoresis. The patient was given IV fluid hydration.   LABORATORY AND RADIOLOGICAL DATA DURING THE HOSPITAL COURSE: Included an EKG that showed sinus rhythm, premature atrial complexes.   Troponin negative. White blood cell count 10.7, H and H 14.7 and 41.8, and platelet count 163,000. Glucose 153, BUN 25, creatinine 1.54, sodium 139, potassium 3.2, chloride 104, CO2 28, calcium 8.8, total bilirubin 1.7, alkaline phosphatase 51, ALT 34, AST 20. Next 2 troponins were negative. Creatinine upon discharge 1.21, potassium 3.4. White count 8.7, hemoglobin 13.4. Clostridium difficile toxin was negative. Other stool studies pending at the time of discharge.   HOSPITAL COURSE PER PROBLEM LIST:  1.  For the patient's hypotension, the patient was given IV fluids and held on his blood pressure medications. Since I only had 1 good blood pressure reading while he was here, 128/54 upon discharge, I did recommend following a few more blood pressures and watching again overnight. The patient wanted to go home. I will send the patient home on no blood pressure medications at this point. He does have a blood pressure monitor as outpatient. I did say if his blood pressure goes above 160 he can  go back on his amlodipine, but I would like him to follow up with Dr. Silvio Pate this week.  2.  Diarrhea. His stool studies are still pending. I held off on antibiotics at this point in time since it is likely of viral etiology since it is going around [employer] at [place].  3.  Hypokalemia. Hydrochlorothiazide could be complicating the issue. Could also be complicated with diarrhea. We will give potassium supplementation upon discharge orally. I did advise the patient to eat and drink.  4.  Hyperlipidemia. The patient is on Zocor.  5.  BPH. The patient is on finasteride.  TIME SPENT ON DISCHARGE: 35 minutes.  ____________________________ Tana Conch. Leslye Peer, MD rjw:sb D: 01/11/2014 15:23:24 ET T: 01/11/2014 15:54:24 ET JOB#: [id number]  cc: Tana Conch. Leslye Peer, MD, <Dictator> Venia Carbon, MD Marisue Brooklyn MD ELECTRONICALLY SIGNED 01/22/2014 15:08

## 2015-02-25 NOTE — H&P (Signed)
PATIENT NAME:  Brendan Barnes, Brendan Barnes MR#:  [id number] DATE OF BIRTH:  05/24/1941  DATE OF ADMISSION:  01/10/2014  PRIMARY CARE PHYSICIAN: Brendan Carbon, MD    REFERRING PHYSICIAN: Orlie Dakin, MD   CHIEF COMPLAINT: Dizziness, diaphoresis.   HISTORY OF PRESENT ILLNESS: Brendan Barnes is a 74 year old male with history of hypertension, hyperlipidemia, started to feel gaseous bloating feeling since this morning. He took his medications of hydrochlorothiazide and lisinopril this morning. He had multiple episodes of will diarrhea; however his diarrhea improved by evening. The patient did not eat any food or drink fluids the entire day. In the evening, he felt the diarrhea was somewhat improved. Patient went to play bridge. He ate some donuts and some water. After that, he started to experience  severe nausea, severe diaphoresis. A physician who was present in the  checked his blood pressure and it was found to be low. EMS was called. The patient states he was extremely diaphoretic. The patient was brought to the Emergency Department, was hypotensive in the 70s. The patient was given IV fluids with currently improved systolic blood pressure in 120s. The patient also states that it has improved bloating feeling in the stomach. Denied having any chest pain at any given time. As mentioned above, was dizzy and diaphoretic. EKG showed  ST depressions in the inferior leads. We did not have any baseline EKG to compare. The patient's initial troponin is negative. The patient was found to have elevated BUN and creatinine and low potassium of 3.2. We do not have any baseline to compare.   PAST MEDICAL HISTORY:  1.  Hypertension.  2.  Hyperlipidemia.  3.  Benign prosthetic hypertrophy.  4.  Back pain.   ALLERGIES: No known drug allergies.   HOME MEDICATIONS:  1.  Zocor 20 mg once a day.  2.  Lisinopril/hydrochlorothiazide 1 tablet once a day.  3.  Finasteride 5 mg once a day.  4.  Diclofenac 75 mg once a day  as needed 5.  Aspirin 162 mg once a day.  6.  Amlodipine 5 mg once a day.   SOCIAL HISTORY:  No history of smoking, drinking alcohol or using illicit drugs. Married, lives with his wife.   PAST SURGICAL HISTORY:  Vasectomy.    FAMILY HISTORY:  Hypertension.   REVIEW OF SYSTEMS:  CONSTITUTIONAL: Experiencing some generalized weakness.  EYES: No change in vision.  ENT: No change in hearing.  RESPIRATORY: No cough, shortness of breath.  CARDIOVASCULAR: No chest pain, palpations.  GASTROINTESTINAL: Had nausea and diarrhea. No abdominal pain.  GENITOURINARY: No dysuria or hematuria.  HEMATOLOGIC: No easy bruising or bleeding.  ENDOCRINE: No polyuria or polydipsia.  SKIN: No rashes or lesions.  MUSCULOSKELETAL: No joint pains and aches.  NEUROLOGIC: No weakness or numbness in any part of the body.   PHYSICAL EXAMINATION:  GENERAL: This is a well-built, well-nourished, age-appropriate male, lying down in the bed, not in distress.  VITAL SIGNS: Temperature 97.5, pulse 74, blood pressure 119/60, respiratory rate of 18, oxygen saturation 98% on room air.  HEENT: Head normocephalic, atraumatic. Eyes: No scleral icterus. Conjunctivae normal. Pupils equal and react to light. Extraocular movements are intact. Mucous membranes moist. No pharyngeal erythema.  NECK: Supple. No lymphadenopathy. No JVD. No carotid bruit.  CHEST: No focal tenderness.  LUNGS: Bilaterally clear to auscultation.  HEART: S1, S2 regular. No murmurs are heard.  ABDOMEN: Bowel sounds present. Soft, nontender, nondistended. No hepatosplenomegaly.  EXTREMITIES: No pedal edema. Pulses 2+.  NEUROLOGIC:  The patient is alert, oriented to place, person and time. Cranial nerves II through XII intact. Motor 5/5 in upper and lower extremities.   LABS: CBC: WBC of 10.7, hemoglobin 14.7, platelet count of 163. Troponin less than 0.02. Complete metabolic panel: BUN 25, creatinine of 1.54, potassium of 3.2. The rest of all the values are  within normal limits.   ASSESSMENT AND PLAN:  Brendan Barnes is a 74 year old male who comes to the Emergency Department with complaints of dizziness and generalized weakness.  1.  Hypotension. This is secondary to diarrhea and blood pressure medications, the dehydration. Continue with IV fluids and follow up. If the patient's blood pressure is over 140, we will start back on the home medications tomorrow.  2.  Diarrhea. The patient states does not have any more symptoms. The patient states he drank some lemonade, which was standing outside the entire day. Also used a lemon, which was somewhat getting spoiled.  3.  Acute renal insufficiency could be secondary to acute tubular necrosis. Continue with IV fluids and follow up. Hold hydrochlorothiazide and lisinopril.  4.  Electrocardiogram changes. We will continue to follow up with cardiac enzymes. We will obtain echocardiogram in the morning.  5.  Deep vein thrombosis prophylaxis with Lovenox.  TIME SPENT:  50 minutes. ____________________________ Monica Becton, MD pv:sw D: 01/10/2014 23:34:47 ET T: 01/11/2014 00:41:17 ET JOB#: [id number]  cc: Monica Becton, MD, <Dictator> Brendan Carbon, MD Monica Becton MD ELECTRONICALLY SIGNED 01/20/2014 4:29

## 2015-02-26 ENCOUNTER — Other Ambulatory Visit: Payer: Self-pay | Admitting: Internal Medicine

## 2015-04-01 ENCOUNTER — Other Ambulatory Visit: Payer: Self-pay | Admitting: Internal Medicine

## 2015-04-10 ENCOUNTER — Other Ambulatory Visit: Payer: Self-pay | Admitting: Internal Medicine

## 2015-04-30 ENCOUNTER — Other Ambulatory Visit: Payer: Self-pay | Admitting: Internal Medicine

## 2015-05-26 ENCOUNTER — Other Ambulatory Visit: Payer: Self-pay | Admitting: Internal Medicine

## 2015-08-10 ENCOUNTER — Other Ambulatory Visit: Payer: Self-pay | Admitting: Internal Medicine

## 2015-08-15 ENCOUNTER — Encounter: Payer: Self-pay | Admitting: Internal Medicine

## 2015-08-15 ENCOUNTER — Ambulatory Visit (INDEPENDENT_AMBULATORY_CARE_PROVIDER_SITE_OTHER): Payer: Medicare Other | Admitting: Internal Medicine

## 2015-08-15 VITALS — BP 140/90 | HR 72 | Temp 97.6°F | Wt 223.0 lb

## 2015-08-15 DIAGNOSIS — R208 Other disturbances of skin sensation: Secondary | ICD-10-CM

## 2015-08-15 DIAGNOSIS — N400 Enlarged prostate without lower urinary tract symptoms: Secondary | ICD-10-CM

## 2015-08-15 DIAGNOSIS — Z23 Encounter for immunization: Secondary | ICD-10-CM

## 2015-08-15 DIAGNOSIS — E785 Hyperlipidemia, unspecified: Secondary | ICD-10-CM | POA: Diagnosis not present

## 2015-08-15 DIAGNOSIS — G629 Polyneuropathy, unspecified: Secondary | ICD-10-CM

## 2015-08-15 DIAGNOSIS — I10 Essential (primary) hypertension: Secondary | ICD-10-CM

## 2015-08-15 DIAGNOSIS — R200 Anesthesia of skin: Secondary | ICD-10-CM

## 2015-08-15 LAB — CBC WITH DIFFERENTIAL/PLATELET
Basophils Absolute: 0 10*3/uL (ref 0.0–0.1)
Basophils Relative: 0.3 % (ref 0.0–3.0)
EOS PCT: 2.4 % (ref 0.0–5.0)
Eosinophils Absolute: 0.2 10*3/uL (ref 0.0–0.7)
HCT: 45.5 % (ref 39.0–52.0)
Hemoglobin: 15.3 g/dL (ref 13.0–17.0)
LYMPHS ABS: 2 10*3/uL (ref 0.7–4.0)
Lymphocytes Relative: 22.9 % (ref 12.0–46.0)
MCHC: 33.6 g/dL (ref 30.0–36.0)
MCV: 88.9 fl (ref 78.0–100.0)
MONO ABS: 0.7 10*3/uL (ref 0.1–1.0)
Monocytes Relative: 8.3 % (ref 3.0–12.0)
NEUTROS PCT: 66.1 % (ref 43.0–77.0)
Neutro Abs: 5.7 10*3/uL (ref 1.4–7.7)
Platelets: 214 10*3/uL (ref 150.0–400.0)
RBC: 5.12 Mil/uL (ref 4.22–5.81)
RDW: 13.3 % (ref 11.5–15.5)
WBC: 8.6 10*3/uL (ref 4.0–10.5)

## 2015-08-15 LAB — LIPID PANEL
CHOLESTEROL: 165 mg/dL (ref 0–200)
HDL: 46.4 mg/dL (ref >39.00)
LDL Cholesterol: 79 mg/dL (ref 0–99)
NonHDL: 118.52
Total CHOL/HDL Ratio: 4
Triglycerides: 198 mg/dL — ABNORMAL HIGH (ref 0.0–149.0)
VLDL: 39.6 mg/dL (ref 0.0–40.0)

## 2015-08-15 LAB — COMPREHENSIVE METABOLIC PANEL
ALK PHOS: 56 U/L (ref 39–117)
ALT: 29 U/L (ref 0–53)
AST: 22 U/L (ref 0–37)
Albumin: 4.3 g/dL (ref 3.5–5.2)
BILIRUBIN TOTAL: 1.2 mg/dL (ref 0.2–1.2)
BUN: 17 mg/dL (ref 6–23)
CO2: 34 mEq/L — ABNORMAL HIGH (ref 19–32)
Calcium: 10 mg/dL (ref 8.4–10.5)
Chloride: 98 mEq/L (ref 96–112)
Creatinine, Ser: 0.8 mg/dL (ref 0.40–1.50)
GFR: 100.32 mL/min (ref >60.00)
GLUCOSE: 99 mg/dL (ref 70–99)
POTASSIUM: 3.5 meq/L (ref 3.5–5.1)
SODIUM: 139 meq/L (ref 135–145)
TOTAL PROTEIN: 7.4 g/dL (ref 6.0–8.3)

## 2015-08-15 LAB — VITAMIN B12: VITAMIN B 12: 349 pg/mL (ref 211–911)

## 2015-08-15 LAB — T4, FREE: FREE T4: 0.82 ng/dL (ref 0.60–1.60)

## 2015-08-15 MED ORDER — AMLODIPINE BESYLATE 5 MG PO TABS
5.0000 mg | ORAL_TABLET | Freq: Every day | ORAL | Status: DC
Start: 2015-08-15 — End: 2016-08-10

## 2015-08-15 MED ORDER — SIMVASTATIN 20 MG PO TABS: 20.0000 mg | ORAL_TABLET | Freq: Every day | ORAL | Status: DC

## 2015-08-15 MED ORDER — LISINOPRIL-HYDROCHLOROTHIAZIDE 20-25 MG PO TABS: 1.0000 | ORAL_TABLET | Freq: Every day | ORAL | Status: DC

## 2015-08-15 MED ORDER — POTASSIUM CHLORIDE CRYS ER 20 MEQ PO TBCR: 20.0000 meq | EXTENDED_RELEASE_TABLET | Freq: Two times a day (BID) | ORAL | Status: DC

## 2015-08-15 NOTE — Addendum Note (Signed)
Addended by: Despina Hidden on: 08/15/2015 11:04 AM   Modules accepted: Orders

## 2015-08-15 NOTE — Assessment & Plan Note (Signed)
BP Readings from Last 3 Encounters:  08/15/15 140/90  01/17/14 146/90  07/07/13 134/88   Reasonable control Discussed lifestyle measures

## 2015-08-15 NOTE — Assessment & Plan Note (Signed)
Mild symptoms--no Rx Doesn't need urologist anymore No more PSA testing

## 2015-08-15 NOTE — Progress Notes (Signed)
Pre visit review using our clinic review tool, if applicable. No additional management support is needed unless otherwise documented below in the visit note. 

## 2015-08-15 NOTE — Assessment & Plan Note (Signed)
Just sensory--tight feeling No sig pain issues or weakness No Rx indicated

## 2015-08-15 NOTE — Assessment & Plan Note (Signed)
No problems with primary prevention with statin

## 2015-08-15 NOTE — Progress Notes (Signed)
Subjective:    Patient ID: Brendan Barnes, male    DOB: [birth year], 74 y.o.   MRN: [id number]  HPI Here for follow up of chronic medical problems He feels he is doing well  Walks a little Weight is up 8# since his last visit  No chest pain No change in exercise tolerance No SOB No dizziness or syncope No edema  No problems with statin No myalgia or GI problems  Still has feeling of tightness in both feet Dad had same thing and eventually got dropped foot No pain  Voids okay No nocturnal problems No daytime problems  Current Outpatient Prescriptions on File Prior to Visit  Medication Sig Dispense Refill  . amLODipine (NORVASC) 5 MG tablet TAKE 1 TABLET DAILY 90 tablet 0  . aspirin 81 MG tablet Take 81 mg by mouth daily.      Marland Kitchen KLOR-CON M20 20 MEQ tablet TAKE 1 TABLET TWICE A DAY 180 tablet 0  . lisinopril-hydrochlorothiazide (PRINZIDE,ZESTORETIC) 20-25 MG per tablet Take 1 tablet by mouth daily. NEEDS OFFICE VISIT FOR REFILLS 90 tablet 0  . simvastatin (ZOCOR) 20 MG tablet TAKE 1 TABLET DAILY 90 tablet 0   No current facility-administered medications on file prior to visit.    No Known Allergies  Past Medical History  Diagnosis Date  . HLD (hyperlipidemia)   . HTN (hypertension)   . BPH (benign prostatic hypertrophy)   . ED (erectile dysfunction)   . IBS (irritable bowel syndrome)   . Osteoarthritis     Past Surgical History  Procedure Laterality Date  . Nasal "growth"  2000  . US echocardiography  06/2005    LV EF normal, Mild LVH, LAE    Family History  Problem Relation Age of Onset  . Prostate cancer Father     prostate  . Hyperlipidemia Father   . Arrhythmia Father   . Kidney failure Mother   . Heart failure Mother   . Gout Mother     Social History   Social History  . Marital Status: Married    Spouse Name: N/A  . Number of Children: 2  . Years of Education: N/A   Occupational History  . 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 Smoking status: Never Smoker   . Smokeless tobacco: Never Used  . Alcohol Use: Yes     Comment: occasional  . Drug Use: No  . Sexual Activity: Not on file   Other Topics Concern  . Not on file   Social History Narrative   Has living will   Wife [name] is his health care POA   Would accept resuscitation attempts   Might accept tube feeds based on situation   Review of Systems No falls No depression or anhedonia Bowels slow at times--- uses miralax      Objective:   Physical Exam  Constitutional: He appears well-developed and well-nourished. No distress.  Neck: Normal range of motion. Neck supple. No thyromegaly present.  Cardiovascular: Normal rate, regular rhythm, normal heart sounds and intact distal pulses.  Exam reveals no gallop.   No murmur heard. Pulmonary/Chest: Effort normal and breath sounds normal. No respiratory distress. He has no wheezes. He has no rales.  Abdominal: Soft. There is no tenderness.  Musculoskeletal: He exhibits no tenderness.  Trace ankle edema  Lymphadenopathy:    He has no cervical adenopathy.  Psychiatric: He has a normal mood and affect. His behavior is normal.  Assessment & Plan:

## 2015-09-06 DIAGNOSIS — C4441 Basal cell carcinoma of skin of scalp and neck: Secondary | ICD-10-CM | POA: Diagnosis not present

## 2015-09-06 DIAGNOSIS — C44219 Basal cell carcinoma of skin of left ear and external auricular canal: Secondary | ICD-10-CM | POA: Diagnosis not present

## 2015-09-06 DIAGNOSIS — D0471 Carcinoma in situ of skin of right lower limb, including hip: Secondary | ICD-10-CM | POA: Diagnosis not present

## 2015-09-06 DIAGNOSIS — L821 Other seborrheic keratosis: Secondary | ICD-10-CM | POA: Diagnosis not present

## 2015-09-06 DIAGNOSIS — D485 Neoplasm of uncertain behavior of skin: Secondary | ICD-10-CM | POA: Diagnosis not present

## 2015-09-06 DIAGNOSIS — L570 Actinic keratosis: Secondary | ICD-10-CM | POA: Diagnosis not present

## 2015-09-06 DIAGNOSIS — L905 Scar conditions and fibrosis of skin: Secondary | ICD-10-CM | POA: Diagnosis not present

## 2015-09-06 DIAGNOSIS — C4401 Basal cell carcinoma of skin of lip: Secondary | ICD-10-CM | POA: Diagnosis not present

## 2015-09-14 ENCOUNTER — Encounter: Payer: Self-pay | Admitting: Internal Medicine

## 2015-09-15 DIAGNOSIS — L905 Scar conditions and fibrosis of skin: Secondary | ICD-10-CM | POA: Diagnosis not present

## 2015-09-15 DIAGNOSIS — C4401 Basal cell carcinoma of skin of lip: Secondary | ICD-10-CM | POA: Diagnosis not present

## 2015-09-15 DIAGNOSIS — C44319 Basal cell carcinoma of skin of other parts of face: Secondary | ICD-10-CM | POA: Diagnosis not present

## 2015-09-16 ENCOUNTER — Encounter: Payer: Self-pay | Admitting: Emergency Medicine

## 2015-09-16 ENCOUNTER — Emergency Department
Admission: EM | Admit: 2015-09-16 | Discharge: 2015-09-16 | Disposition: A | Discharge status: Home / Self Care | Payer: Medicare Other | Attending: Emergency Medicine | Admitting: Emergency Medicine

## 2015-09-16 DIAGNOSIS — I10 Essential (primary) hypertension: Secondary | ICD-10-CM | POA: Diagnosis not present

## 2015-09-16 DIAGNOSIS — L7621 Postprocedural hemorrhage and hematoma of skin and subcutaneous tissue following a dermatologic procedure: Secondary | ICD-10-CM | POA: Diagnosis not present

## 2015-09-16 DIAGNOSIS — Z7982 Long term (current) use of aspirin: Secondary | ICD-10-CM | POA: Insufficient documentation

## 2015-09-16 DIAGNOSIS — R58 Hemorrhage, not elsewhere classified: Secondary | ICD-10-CM

## 2015-09-16 DIAGNOSIS — L7622 Postprocedural hemorrhage and hematoma of skin and subcutaneous tissue following other procedure: Secondary | ICD-10-CM | POA: Insufficient documentation

## 2015-09-16 DIAGNOSIS — Z79899 Other long term (current) drug therapy: Secondary | ICD-10-CM | POA: Insufficient documentation

## 2015-09-16 NOTE — ED Notes (Signed)
During dressing change, area wont stop bleeding. Not on blood thinner - did not take his nightly asa

## 2015-09-16 NOTE — ED Provider Notes (Signed)
[facility] Emergency Department Provider Note  ____________________________________________  Time seen: Approximately 3:33 PM  I have reviewed the triage vital signs and the nursing notes.   HISTORY  Chief Complaint Bleeding/Bruising    HPI Brendan Barnes is a 74 y.o. 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 The incision is just before the left ear. It has been slowly bleeding over the last 24 hours. No pain, redness, fevers or chills. Otherwise doing well. He is on aspirin.   Past Medical History  Diagnosis Date  . HLD (hyperlipidemia)   . HTN (hypertension)   . BPH (benign prostatic hypertrophy)   . ED (erectile dysfunction)   . IBS (irritable bowel syndrome)   . Osteoarthritis     Patient Active Problem List   Diagnosis Date Noted  . Routine general medical examination at a health care facility 02/19/2011  . Osteoarthritis of back 02/04/2008  . Neuropathy ([place]) 08/07/2007  . IRRITABLE BOWEL SYNDROME 08/07/2007  . Hyperlipemia 02/06/2007  . Essential hypertension, benign 02/06/2007  . BPH (benign prostatic hypertrophy) 02/06/2007  . ERECTILE DYSFUNCTION, ORGANIC 02/06/2007  . SCIATICA 02/06/2007    Past Surgical History  Procedure Laterality Date  . Nasal "growth"  2000  . US echocardiography  06/2005    LV EF normal, Mild LVH, LAE    Current Outpatient Rx  Name  Route  Sig  Dispense  Refill  . amLODipine (NORVASC) 5 MG tablet   Oral   Take 1 tablet (5 mg total) by mouth daily.   90 tablet   3   . aspirin 81 MG tablet   Oral   Take 81 mg by mouth daily.           . Cholecalciferol (VITAMIN D-3) 1000 UNITS CAPS   Oral   Take by mouth daily.         Marland Kitchen lisinopril-hydrochlorothiazide (PRINZIDE,ZESTORETIC) 20-25 MG tablet   Oral   Take 1 tablet by mouth daily.   90 tablet   3   . polyethylene glycol (MIRALAX / GLYCOLAX) packet   Oral   Take 17 g by mouth 3 (three) times a  week.         . potassium chloride SA (KLOR-CON M20) 20 MEQ tablet   Oral   Take 1 tablet (20 mEq total) by mouth 2 (two) times daily.   180 tablet   3   . simvastatin (ZOCOR) 20 MG tablet   Oral   Take 1 tablet (20 mg total) by mouth daily.   90 tablet   3     Allergies Review of patient's allergies indicates no known allergies.  Family History  Problem Relation Age of Onset  . Prostate cancer Father     prostate  . Hyperlipidemia Father   . Arrhythmia Father   . Kidney failure Mother   . Heart failure Mother   . Gout Mother     Social History Social History  Substance Use Topics  . Smoking status: Never Smoker   . Smokeless tobacco: Never Used  . Alcohol Use: Yes     Comment: occasional    Review of Systems Constitutional: No fever/chills Eyes: No visual changes. ENT: No sore throat. Cardiovascular: Denies chest pain. Respiratory: Denies shortness of breath. Skin: Negative for rash. Neurological: Negative for headaches, focal weakness or numbness.  10-point ROS otherwise negative.  ____________________________________________   PHYSICAL EXAM:  VITAL SIGNS: ED Triage Vitals  Enc Vitals Group  BP 09/16/15 1405 146/98 mmHg     Pulse Rate 09/16/15 1405 66     Resp 09/16/15 1405 16     Temp 09/16/15 1405 98.1 F (36.7 C)     Temp Source 09/16/15 1405 Oral     SpO2 09/16/15 1405 98 %     Weight 09/16/15 1405 217 lb (98.431 kg)     Height 09/16/15 1405 5\' 11"  (1.803 m)     Head Cir --      Peak Flow --      Pain Score --      Pain Loc --      Pain Edu? --      Excl. in [place]? --     Constitutional: Alert and oriented. Well appearing and in no acute distress. Eyes: Conjunctivae are normal. PERRL. EOMI. Head: Atraumatic.  Sutures intact to left preauricular area with no tenderness or formed hematoma.   Nose: No congestion/rhinnorhea. Mouth/Throat: Mucous membranes are moist.  Oropharynx non-erythematous. Neck: No stridor.   Cardiovascular:  Normal rate, regular rhythm. Grossly normal heart sounds.  Good peripheral circulation. Respiratory: Normal respiratory effort. Skin:  Skin is warm, dry and intact. No rash noted.  See ENT Psychiatric: Mood and affect are normal. Speech and behavior are normal.  ____________________________________________   LABS (all labs ordered are listed, but only abnormal results are displayed)  Labs Reviewed - No data to display ____________________________________________  EKG   ____________________________________________  RADIOLOGY   ____________________________________________   PROCEDURES  Procedure(s) performed:  Applied tissue adhesive to the incision/suture site  Critical Care performed: No  ____________________________________________   INITIAL IMPRESSION / ASSESSMENT AND PLAN / ED COURSE  Pertinent labs & imaging results that were available during my care of the patient were reviewed by me and considered in my medical decision making (see chart for details).  74 year old man with excision of a basal cell cancer yesterday by dermatology. He presents with persistent bleeding from the wound site. This is a 1 inch laceration with sutures intact to the preauricular region of the left ear. No tenderness, redness or signs of infection. He had a steady slow drip of blood. Area was cleaned and tissue adhesive applied with resolution of the bleeding. Encouraged to apply pressure, and ice. He will follow-up with dermatology this week. Return to the emergency room for any concerns. ____________________________________________   FINAL CLINICAL IMPRESSION(S) / ED DIAGNOSES  Final diagnoses:  Bleeding      Mortimer Fries, PA-C 09/16/15 1547  Earleen Newport, MD 09/16/15 [phone]

## 2015-09-16 NOTE — Discharge Instructions (Signed)
Continue to apply pressure and ice to the area to aid in stopping the bleeding. Return to the emergency room for any concerns. Follow-up with your dermatologist for further evaluation.

## 2015-09-18 ENCOUNTER — Telehealth: Payer: Self-pay | Admitting: *Deleted

## 2015-09-18 NOTE — Telephone Encounter (Signed)
Left message on machine asking patient how he was doing after his visit to the ER. Advised pt to return my call

## 2015-09-19 NOTE — Telephone Encounter (Signed)
Pt returned your call. Please call back [phone]  Thanks

## 2015-09-19 NOTE — Telephone Encounter (Signed)
.  left message to have patient return my call.  

## 2015-09-20 DIAGNOSIS — R972 Elevated prostate specific antigen [PSA]: Secondary | ICD-10-CM | POA: Insufficient documentation

## 2015-09-20 DIAGNOSIS — K4090 Unilateral inguinal hernia, without obstruction or gangrene, not specified as recurrent: Secondary | ICD-10-CM | POA: Diagnosis not present

## 2015-09-20 DIAGNOSIS — Z8042 Family history of malignant neoplasm of prostate: Secondary | ICD-10-CM | POA: Diagnosis not present

## 2015-09-20 DIAGNOSIS — N138 Other obstructive and reflux uropathy: Secondary | ICD-10-CM | POA: Diagnosis not present

## 2015-09-20 DIAGNOSIS — R339 Retention of urine, unspecified: Secondary | ICD-10-CM | POA: Diagnosis not present

## 2015-09-20 DIAGNOSIS — N403 Nodular prostate with lower urinary tract symptoms: Secondary | ICD-10-CM | POA: Diagnosis not present

## 2015-09-20 NOTE — Telephone Encounter (Signed)
Spoke with wife and they went to the ED to get the bleeding, they will follow-up with dermatology on Friday.

## 2015-10-06 DIAGNOSIS — C4441 Basal cell carcinoma of skin of scalp and neck: Secondary | ICD-10-CM | POA: Diagnosis not present

## 2015-12-20 ENCOUNTER — Telehealth: Payer: Self-pay

## 2015-12-20 NOTE — Telephone Encounter (Signed)
Attempted to contact pt regarding AWV. Left voicemail msg.

## 2016-02-22 DIAGNOSIS — D045 Carcinoma in situ of skin of trunk: Secondary | ICD-10-CM | POA: Diagnosis not present

## 2016-02-22 DIAGNOSIS — Z85828 Personal history of other malignant neoplasm of skin: Secondary | ICD-10-CM | POA: Diagnosis not present

## 2016-02-22 DIAGNOSIS — Z08 Encounter for follow-up examination after completed treatment for malignant neoplasm: Secondary | ICD-10-CM | POA: Diagnosis not present

## 2016-02-22 DIAGNOSIS — D044 Carcinoma in situ of skin of scalp and neck: Secondary | ICD-10-CM | POA: Diagnosis not present

## 2016-02-22 DIAGNOSIS — D485 Neoplasm of uncertain behavior of skin: Secondary | ICD-10-CM | POA: Diagnosis not present

## 2016-03-20 DIAGNOSIS — D044 Carcinoma in situ of skin of scalp and neck: Secondary | ICD-10-CM | POA: Diagnosis not present

## 2016-06-26 ENCOUNTER — Telehealth: Payer: Self-pay | Admitting: Internal Medicine

## 2016-06-26 NOTE — Telephone Encounter (Signed)
Left message on home phone asking pt to call office °Pt needs to schedule medicare wellness appointment before 08/03/16 per brian email dated 06/26/16 °

## 2016-07-24 ENCOUNTER — Ambulatory Visit (INDEPENDENT_AMBULATORY_CARE_PROVIDER_SITE_OTHER): Payer: Medicare Other | Admitting: Internal Medicine

## 2016-07-24 ENCOUNTER — Encounter: Payer: Self-pay | Admitting: Internal Medicine

## 2016-07-24 VITALS — BP 132/88 | HR 74 | Temp 97.6°F | Ht 70.0 in | Wt 217.0 lb

## 2016-07-24 DIAGNOSIS — Z0000 Encounter for general adult medical examination without abnormal findings: Secondary | ICD-10-CM | POA: Diagnosis not present

## 2016-07-24 DIAGNOSIS — N400 Enlarged prostate without lower urinary tract symptoms: Secondary | ICD-10-CM | POA: Diagnosis not present

## 2016-07-24 DIAGNOSIS — Z7189 Other specified counseling: Secondary | ICD-10-CM

## 2016-07-24 DIAGNOSIS — F439 Reaction to severe stress, unspecified: Secondary | ICD-10-CM | POA: Insufficient documentation

## 2016-07-24 DIAGNOSIS — Z23 Encounter for immunization: Secondary | ICD-10-CM | POA: Diagnosis not present

## 2016-07-24 DIAGNOSIS — I10 Essential (primary) hypertension: Secondary | ICD-10-CM

## 2016-07-24 DIAGNOSIS — E785 Hyperlipidemia, unspecified: Secondary | ICD-10-CM

## 2016-07-24 DIAGNOSIS — Z658 Other specified problems related to psychosocial circumstances: Secondary | ICD-10-CM

## 2016-07-24 DIAGNOSIS — G629 Polyneuropathy, unspecified: Secondary | ICD-10-CM

## 2016-07-24 DIAGNOSIS — D126 Benign neoplasm of colon, unspecified: Secondary | ICD-10-CM

## 2016-07-24 LAB — COMPREHENSIVE METABOLIC PANEL
ALT: 29 U/L (ref 0–53)
AST: 25 U/L (ref 0–37)
Albumin: 3.9 g/dL (ref 3.5–5.2)
Alkaline Phosphatase: 58 U/L (ref 39–117)
BILIRUBIN TOTAL: 1.2 mg/dL (ref 0.2–1.2)
BUN: 17 mg/dL (ref 6–23)
CO2: 34 mEq/L — ABNORMAL HIGH (ref 19–32)
Calcium: 9.2 mg/dL (ref 8.4–10.5)
Chloride: 99 mEq/L (ref 96–112)
Creatinine, Ser: 0.84 mg/dL (ref 0.40–1.50)
GFR: 94.59 mL/min (ref >60.00)
GLUCOSE: 113 mg/dL — AB (ref 70–99)
POTASSIUM: 3.3 meq/L — AB (ref 3.5–5.1)
Sodium: 138 mEq/L (ref 135–145)
TOTAL PROTEIN: 6.9 g/dL (ref 6.0–8.3)

## 2016-07-24 LAB — LIPID PANEL
Cholesterol: 140 mg/dL (ref 0–200)
HDL: 38.8 mg/dL — AB (ref >39.00)
NONHDL: 101.01
TRIGLYCERIDES: 218 mg/dL — AB (ref 0.0–149.0)
Total CHOL/HDL Ratio: 4
VLDL: 43.6 mg/dL — ABNORMAL HIGH (ref 0.0–40.0)

## 2016-07-24 LAB — CBC WITH DIFFERENTIAL/PLATELET
BASOS ABS: 0 10*3/uL (ref 0.0–0.1)
BASOS PCT: 0.5 % (ref 0.0–3.0)
EOS ABS: 0.2 10*3/uL (ref 0.0–0.7)
Eosinophils Relative: 2.5 % (ref 0.0–5.0)
HEMATOCRIT: 42.3 % (ref 39.0–52.0)
Hemoglobin: 14.6 g/dL (ref 13.0–17.0)
LYMPHS ABS: 1.8 10*3/uL (ref 0.7–4.0)
LYMPHS PCT: 27.2 % (ref 12.0–46.0)
MCHC: 34.4 g/dL (ref 30.0–36.0)
MCV: 87.3 fl (ref 78.0–100.0)
Monocytes Absolute: 0.9 10*3/uL (ref 0.1–1.0)
Monocytes Relative: 14 % — ABNORMAL HIGH (ref 3.0–12.0)
NEUTROS ABS: 3.8 10*3/uL (ref 1.4–7.7)
NEUTROS PCT: 55.8 % (ref 43.0–77.0)
PLATELETS: 180 10*3/uL (ref 150.0–400.0)
RBC: 4.85 Mil/uL (ref 4.22–5.81)
RDW: 13.2 % (ref 11.5–15.5)
WBC: 6.7 10*3/uL (ref 4.0–10.5)

## 2016-07-24 LAB — LDL CHOLESTEROL, DIRECT: LDL DIRECT: 82 mg/dL

## 2016-07-24 MED ORDER — TETANUS-DIPHTHERIA TOXOIDS TD 5-2 LFU IM INJ: 0.5000 mL | INJECTION | Freq: Once | INTRAMUSCULAR | 0 refills | Status: AC

## 2016-07-24 NOTE — Assessment & Plan Note (Signed)
I have personally reviewed the Medicare Annual Wellness questionnaire and have noted 1. The patient's medical and social history 2. Their use of alcohol, tobacco or illicit drugs 3. Their current medications and supplements 4. The patient's functional ability including ADL's, fall risks, home safety risks and hearing or visual             impairment. 5. Diet and physical activities 6. Evidence for depression or mood disorders  The patients weight, height, BMI and visual acuity have been recorded in the chart I have made referrals, counseling and provided education to the patient based review of the above and I have provided the pt with a written personalized care plan for preventive services.  I have provided you with a copy of your personalized plan for preventive services. Please take the time to review along with your updated medication list.  Flu vaccine today Rx for Td Discussed fitness Overdue for colon

## 2016-07-24 NOTE — Assessment & Plan Note (Signed)
BP Readings from Last 3 Encounters:  07/24/16 132/88  09/16/15 (!) 146/98  08/15/15 140/90   Good control  No change needed

## 2016-07-24 NOTE — Addendum Note (Signed)
Addended by: Pilar Grammes on: 07/24/2016 11:58 AM   Modules accepted: Orders

## 2016-07-24 NOTE — Assessment & Plan Note (Signed)
Ongoing symptoms No worse though

## 2016-07-24 NOTE — Assessment & Plan Note (Signed)
Okay on avodart

## 2016-07-24 NOTE — Assessment & Plan Note (Signed)
He really needs to hire aides to help with his wife

## 2016-07-24 NOTE — Progress Notes (Signed)
Subjective:    Patient ID: Brendan Barnes, male    DOB: November 16, 1940, 75 y.o.   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 Not really anhedonic--still enjoys bridge but limited with other activities  Using more diclofenac lately--back pain is more frequent Due for colonoscopy and eye exam--can't find time to get them scheduled  Voiding okay with the avodart Nocturia is mostly gone  No chest pain No SOB No dizziness or syncope No edema  No apparent trouble with statin  Current Outpatient Prescriptions on File Prior to Visit  Medication Sig Dispense Refill  . amLODipine (NORVASC) 5 MG tablet Take 1 tablet (5 mg total) by mouth daily. 90 tablet 3  . Cholecalciferol (VITAMIN D-3) 1000 UNITS CAPS Take by mouth daily.    Marland Kitchen lisinopril-hydrochlorothiazide (PRINZIDE,ZESTORETIC) 20-25 MG tablet Take 1 tablet by mouth daily. 90 tablet 3  . polyethylene glycol (MIRALAX / GLYCOLAX) packet Take 17 g by mouth 3 (three) times a week.    . potassium chloride SA (KLOR-CON M20) 20 MEQ tablet Take 1 tablet (20 mEq total) by mouth 2 (two) times daily. 180 tablet 3  . simvastatin (ZOCOR) 20 MG tablet Take 1 tablet (20 mg total) by mouth daily. 90 tablet 3  . aspirin 81 MG tablet Take 81 mg by mouth daily.         No current facility-administered medications on file prior to visit.     No Known Allergies  Past Medical History:  Diagnosis Date  . BPH (benign prostatic hypertrophy)   . ED (erectile dysfunction)   . HLD (hyperlipidemia)   . HTN (hypertension)   . IBS (irritable bowel syndrome)   . Osteoarthritis     Past Surgical History:  Procedure Laterality Date  . nasal "growth"  2000  . US ECHOCARDIOGRAPHY  06/2005   LV EF normal, Mild LVH, LAE    Family History  Problem Relation Age of Onset  . Prostate cancer Father     prostate  . Hyperlipidemia Father   . Arrhythmia Father   . Kidney failure Mother   . Heart failure Mother   . Gout Mother   . Scoliosis Son     Social History   Social History  . Marital status: Married    Spouse name: N/A  . Number of children: 2  . Years of education: N/A   Occupational History  . 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 Smoking status: Never Smoker  . Smokeless tobacco: Never Used  . Alcohol use Yes     Comment: occasional  . Drug use: No  . Sexual activity: Not on file   Other Topics Concern  . Not on file   Social History Narrative   Has living will   Wife Brendan Barnes is his health care POA  Would accept resuscitation attempts   Might accept tube feeds based on situation   Review of Systems Not sleeping well-- partly due to wife Appetite is "too good" Weight is about the same Wears seat belt Teeth are okay---keeps up with dentist (Brendan Barnes) Bowels are okay. No blood in stool or black stool Uses gas-x for reflux with success.  No dysphagia Keeps up with dermatologist No change in chronic nerve pain     Objective:   Physical Exam  Constitutional: He is oriented to person, place, and time. He appears well-developed and well-nourished. No distress.  HENT:  Mouth/Throat: Oropharynx is clear and moist. No oropharyngeal exudate.  Neck: Normal range of motion. Neck supple. No thyromegaly  present.  Cardiovascular: Normal rate, regular rhythm, normal heart sounds and intact distal pulses.  Exam reveals no gallop.   No murmur heard. Pulmonary/Chest: Effort normal and breath sounds normal. No respiratory distress. He has no wheezes. He has no rales.  Abdominal: Soft. There is no tenderness.  Musculoskeletal: He exhibits no edema or tenderness.  Lymphadenopathy:    He has no cervical adenopathy.  Neurological: He is alert and oriented to person, place, and time.  President--- "Dwaine Deter, Bush" [phone] [id number] Recall 3/3  Skin: No rash noted. No erythema.  Psychiatric: He has a normal mood and affect. His behavior is normal.          Assessment & Plan:

## 2016-07-24 NOTE — Progress Notes (Signed)
Pre visit review using our clinic review tool, if applicable. No additional management support is needed unless otherwise documented below in the visit note. 

## 2016-07-24 NOTE — Assessment & Plan Note (Signed)
See social history 

## 2016-07-24 NOTE — Assessment & Plan Note (Signed)
No problems with statin 

## 2016-08-07 DIAGNOSIS — H2513 Age-related nuclear cataract, bilateral: Secondary | ICD-10-CM | POA: Diagnosis not present

## 2016-08-10 ENCOUNTER — Other Ambulatory Visit: Payer: Self-pay | Admitting: Internal Medicine

## 2016-08-15 DIAGNOSIS — C4441 Basal cell carcinoma of skin of scalp and neck: Secondary | ICD-10-CM | POA: Diagnosis not present

## 2016-08-15 DIAGNOSIS — L600 Ingrowing nail: Secondary | ICD-10-CM | POA: Diagnosis not present

## 2016-08-15 DIAGNOSIS — C44619 Basal cell carcinoma of skin of left upper limb, including shoulder: Secondary | ICD-10-CM | POA: Diagnosis not present

## 2016-08-15 DIAGNOSIS — D485 Neoplasm of uncertain behavior of skin: Secondary | ICD-10-CM | POA: Diagnosis not present

## 2016-08-15 DIAGNOSIS — Z85828 Personal history of other malignant neoplasm of skin: Secondary | ICD-10-CM | POA: Diagnosis not present

## 2016-08-15 DIAGNOSIS — Z08 Encounter for follow-up examination after completed treatment for malignant neoplasm: Secondary | ICD-10-CM | POA: Diagnosis not present

## 2016-08-16 ENCOUNTER — Ambulatory Visit: Payer: Medicare Other

## 2016-08-16 ENCOUNTER — Encounter: Payer: Medicare Other | Admitting: Internal Medicine

## 2016-08-21 DIAGNOSIS — L905 Scar conditions and fibrosis of skin: Secondary | ICD-10-CM | POA: Diagnosis not present

## 2016-08-21 DIAGNOSIS — C4441 Basal cell carcinoma of skin of scalp and neck: Secondary | ICD-10-CM | POA: Diagnosis not present

## 2016-09-05 DIAGNOSIS — C44619 Basal cell carcinoma of skin of left upper limb, including shoulder: Secondary | ICD-10-CM | POA: Diagnosis not present

## 2016-09-05 DIAGNOSIS — L905 Scar conditions and fibrosis of skin: Secondary | ICD-10-CM | POA: Diagnosis not present

## 2016-09-12 DIAGNOSIS — L600 Ingrowing nail: Secondary | ICD-10-CM | POA: Diagnosis not present

## 2016-09-13 DIAGNOSIS — K5909 Other constipation: Secondary | ICD-10-CM | POA: Diagnosis not present

## 2016-09-13 DIAGNOSIS — Z86010 Personal history of colonic polyps: Secondary | ICD-10-CM | POA: Diagnosis not present

## 2016-10-09 DIAGNOSIS — Z8042 Family history of malignant neoplasm of prostate: Secondary | ICD-10-CM | POA: Diagnosis not present

## 2016-10-09 DIAGNOSIS — N403 Nodular prostate with lower urinary tract symptoms: Secondary | ICD-10-CM | POA: Diagnosis not present

## 2016-10-09 DIAGNOSIS — N138 Other obstructive and reflux uropathy: Secondary | ICD-10-CM | POA: Diagnosis not present

## 2016-10-09 DIAGNOSIS — R339 Retention of urine, unspecified: Secondary | ICD-10-CM | POA: Diagnosis not present

## 2016-10-09 DIAGNOSIS — R972 Elevated prostate specific antigen [PSA]: Secondary | ICD-10-CM | POA: Diagnosis not present

## 2016-12-12 DIAGNOSIS — D485 Neoplasm of uncertain behavior of skin: Secondary | ICD-10-CM | POA: Diagnosis not present

## 2016-12-12 DIAGNOSIS — Z08 Encounter for follow-up examination after completed treatment for malignant neoplasm: Secondary | ICD-10-CM | POA: Diagnosis not present

## 2016-12-12 DIAGNOSIS — Z09 Encounter for follow-up examination after completed treatment for conditions other than malignant neoplasm: Secondary | ICD-10-CM | POA: Diagnosis not present

## 2016-12-12 DIAGNOSIS — Z85828 Personal history of other malignant neoplasm of skin: Secondary | ICD-10-CM | POA: Diagnosis not present

## 2016-12-12 DIAGNOSIS — L820 Inflamed seborrheic keratosis: Secondary | ICD-10-CM | POA: Diagnosis not present

## 2016-12-12 DIAGNOSIS — Z872 Personal history of diseases of the skin and subcutaneous tissue: Secondary | ICD-10-CM | POA: Diagnosis not present

## 2017-01-03 ENCOUNTER — Encounter: Payer: Self-pay | Admitting: *Deleted

## 2017-01-06 ENCOUNTER — Ambulatory Visit
Admission: RE | Admit: 2017-01-06 | Discharge: 2017-01-06 | Disposition: A | Discharge status: Home / Self Care | Payer: Medicare Other | Source: Ambulatory Visit | Attending: Gastroenterology | Admitting: Gastroenterology

## 2017-01-06 ENCOUNTER — Encounter
Admission: RE | Disposition: A | Discharge status: Home / Self Care | Payer: Self-pay | Source: Ambulatory Visit | Attending: Gastroenterology

## 2017-01-06 ENCOUNTER — Ambulatory Visit: Payer: Medicare Other | Admitting: Anesthesiology

## 2017-01-06 ENCOUNTER — Encounter: Payer: Self-pay | Admitting: *Deleted

## 2017-01-06 DIAGNOSIS — K640 First degree hemorrhoids: Secondary | ICD-10-CM | POA: Diagnosis not present

## 2017-01-06 DIAGNOSIS — Z7982 Long term (current) use of aspirin: Secondary | ICD-10-CM | POA: Insufficient documentation

## 2017-01-06 DIAGNOSIS — Z86010 Personal history of colonic polyps: Secondary | ICD-10-CM | POA: Diagnosis not present

## 2017-01-06 DIAGNOSIS — Z1211 Encounter for screening for malignant neoplasm of colon: Secondary | ICD-10-CM | POA: Diagnosis not present

## 2017-01-06 DIAGNOSIS — K648 Other hemorrhoids: Secondary | ICD-10-CM | POA: Diagnosis not present

## 2017-01-06 DIAGNOSIS — K5730 Diverticulosis of large intestine without perforation or abscess without bleeding: Secondary | ICD-10-CM | POA: Diagnosis not present

## 2017-01-06 DIAGNOSIS — Z79899 Other long term (current) drug therapy: Secondary | ICD-10-CM | POA: Diagnosis not present

## 2017-01-06 DIAGNOSIS — Z955 Presence of coronary angioplasty implant and graft: Secondary | ICD-10-CM | POA: Insufficient documentation

## 2017-01-06 DIAGNOSIS — M1990 Unspecified osteoarthritis, unspecified site: Secondary | ICD-10-CM | POA: Diagnosis not present

## 2017-01-06 DIAGNOSIS — I10 Essential (primary) hypertension: Secondary | ICD-10-CM | POA: Diagnosis not present

## 2017-01-06 DIAGNOSIS — K589 Irritable bowel syndrome without diarrhea: Secondary | ICD-10-CM | POA: Insufficient documentation

## 2017-01-06 DIAGNOSIS — N400 Enlarged prostate without lower urinary tract symptoms: Secondary | ICD-10-CM | POA: Insufficient documentation

## 2017-01-06 DIAGNOSIS — K5790 Diverticulosis of intestine, part unspecified, without perforation or abscess without bleeding: Secondary | ICD-10-CM | POA: Diagnosis not present

## 2017-01-06 DIAGNOSIS — E785 Hyperlipidemia, unspecified: Secondary | ICD-10-CM | POA: Insufficient documentation

## 2017-01-06 HISTORY — PX: COLONOSCOPY WITH PROPOFOL: SHX5780

## 2017-01-06 SURGERY — COLONOSCOPY WITH PROPOFOL
Anesthesia: General

## 2017-01-06 MED ORDER — SODIUM CHLORIDE 0.9 % IV SOLN
INTRAVENOUS | Status: DC
  Administered 2017-01-06: 10:00:00 via INTRAVENOUS

## 2017-01-06 MED ORDER — PROPOFOL 10 MG/ML IV BOLUS
INTRAVENOUS | Status: DC | PRN
  Administered 2017-01-06: 100 mg via INTRAVENOUS

## 2017-01-06 MED ORDER — SODIUM CHLORIDE 0.9 % IV SOLN: INTRAVENOUS | Status: DC

## 2017-01-06 MED ORDER — PROPOFOL 500 MG/50ML IV EMUL
INTRAVENOUS | Status: DC | PRN
  Administered 2017-01-06: 125 ug/kg/min via INTRAVENOUS

## 2017-01-06 MED ORDER — LIDOCAINE HCL (CARDIAC) 20 MG/ML IV SOLN
INTRAVENOUS | Status: DC | PRN
  Administered 2017-01-06: 100 mg via INTRAVENOUS

## 2017-01-06 NOTE — Anesthesia Post-op Follow-up Note (Cosign Needed)
Anesthesia QCDR form completed.        

## 2017-01-06 NOTE — Transfer of Care (Signed)
Immediate Anesthesia Transfer of Care Note  Patient: Brendan Barnes  Procedure(s) Performed: Procedure(s): COLONOSCOPY WITH PROPOFOL (N/A)  Patient Location: PACU  Anesthesia Type:General  Level of Consciousness: sedated  Airway & Oxygen Therapy: Patient Spontanous Breathing and Patient connected to nasal cannula oxygen  Post-op Assessment: Report given to RN and Post -op Vital signs reviewed and stable  Post vital signs: Reviewed and stable  Last Vitals:  Vitals:   01/06/17 1002  BP: (!) 123/52  Pulse: 82  Resp: 14  Temp: 36.2 C    Last Pain:  Vitals:   01/06/17 1002  TempSrc: Tympanic         Complications: No apparent anesthesia complications

## 2017-01-06 NOTE — Anesthesia Preprocedure Evaluation (Signed)
Anesthesia Evaluation  Patient identified by MRN, date of birth, ID band Patient awake    Reviewed: Allergy & Precautions, H&P , NPO status , Patient's Chart, lab work & pertinent test results, reviewed documented beta blocker date and time   History of Anesthesia Complications Negative for: history of anesthetic complications  Airway Mallampati: III  TM Distance: >3 FB Neck ROM: full    Dental  (+) Teeth Intact, Dental Advidsory Given   Pulmonary neg pulmonary ROS,           Cardiovascular Exercise Tolerance: Good hypertension, (-) angina(-) CAD, (-) Past MI, (-) Cardiac Stents and (-) CABG (-) dysrhythmias (-) Valvular Problems/Murmurs     Neuro/Psych negative neurological ROS  negative psych ROS   GI/Hepatic negative GI ROS, Neg liver ROS,   Endo/Other  negative endocrine ROS  Renal/GU negative Renal ROS  negative genitourinary   Musculoskeletal   Abdominal   Peds  Hematology negative hematology ROS (+)   Anesthesia Other Findings Past Medical History: No date: BPH (benign prostatic hypertrophy) No date: ED (erectile dysfunction) No date: HLD (hyperlipidemia) No date: HTN (hypertension) No date: IBS (irritable bowel syndrome) No date: Osteoarthritis   Reproductive/Obstetrics negative OB ROS                             Anesthesia Physical Anesthesia Plan  ASA: II  Anesthesia Plan: General   Post-op Pain Management:    Induction:   Airway Management Planned:   Additional Equipment:   Intra-op Plan:   Post-operative Plan:   Informed Consent: I have reviewed the patients History and Physical, chart, labs and discussed the procedure including the risks, benefits and alternatives for the proposed anesthesia with the patient or authorized representative who has indicated his/her understanding and acceptance.   Dental Advisory Given  Plan Discussed with: Anesthesiologist,  CRNA and Surgeon  Anesthesia Plan Comments:         Anesthesia Quick Evaluation

## 2017-01-06 NOTE — H&P (Signed)
Outpatient short stay form Pre-procedure 01/06/2017 10:53 AM Brendan Sails MD  Primary Physician: Dr. Viviana Simpler  Reason for visit:  Colonoscopy  History of present illness:  Patient is a 76 year old male presenting today as above. Has a personal history of adenomatous colon polyps. He tolerated his prep well. He takes no aspirin or blood thinning agents, except 81 mg aspirin..    Current Facility-Administered Medications:  .  0.9 %  sodium chloride infusion, , Intravenous, Continuous, Brendan Sails, MD, Last Rate: 20 mL/hr at 01/06/17 1026 .  0.9 %  sodium chloride infusion, , Intravenous, Continuous, Brendan Sails, MD .  0.9 %  sodium chloride infusion, , Intravenous, Continuous, Brendan Sails, MD  Prescriptions Prior to Admission  Medication Sig Dispense Refill Last Dose  . amLODipine (NORVASC) 5 MG tablet TAKE 1 TABLET DAILY 90 tablet 3 01/05/2017 at 2100  . Cholecalciferol (VITAMIN D-3) 1000 UNITS CAPS Take by mouth daily.   Past Week at Unknown time  . diclofenac (VOLTAREN) 75 MG EC tablet Take 75 mg by mouth as needed.   Past Week at Unknown time  . finasteride (PROSCAR) 5 MG tablet Take 5 mg by mouth daily.   01/06/2017 at 0630  . KLOR-CON M20 20 MEQ tablet TAKE 1 TABLET TWICE A DAY 180 tablet 3 01/06/2017 at 0630  . lisinopril-hydrochlorothiazide (PRINZIDE,ZESTORETIC) 20-25 MG tablet TAKE 1 TABLET DAILY 90 tablet 3 01/06/2017 at 0630  . polyethylene glycol (MIRALAX / GLYCOLAX) packet Take 17 g by mouth 3 (three) times a week.   01/05/2017 at Unknown time  . simvastatin (ZOCOR) 20 MG tablet TAKE 1 TABLET DAILY 90 tablet 3 01/05/2017 at 2100  . aspirin 81 MG tablet Take 81 mg by mouth daily.     01/01/2017  . dutasteride (AVODART) 0.5 MG capsule Take 0.5 mg by mouth daily.   Completed Course at Unknown time     No Known Allergies   Past Medical History:  Diagnosis Date  . BPH (benign prostatic hypertrophy)   . ED (erectile dysfunction)   . HLD (hyperlipidemia)   .  HTN (hypertension)   . IBS (irritable bowel syndrome)   . Osteoarthritis     Review of systems:      Physical Exam    Heart and lungs: Regular rate and rhythm without rub or gallop, lungs are bilaterally clear.    HEENT: Normocephalic atraumatic eyes are anicteric    Other:     Pertinant exam for procedure: Soft nontender nondistended bowel sounds positive normoactive.    Planned proceedures: Colonoscopy and indicated procedures. I have discussed the risks benefits and complications of procedures to include not limited to bleeding, infection, perforation and the risk of sedation and the patient wishes to proceed.    Brendan Sails, MD Gastroenterology 01/06/2017  10:53 AM

## 2017-01-06 NOTE — Op Note (Signed)
[facility]astroenterology Patient Name: Brendan Barnes Procedure Date: 01/06/2017 10:50 AM MRN: [id number] Account #: [id number] Date of Birth: [birth year] Admit Type: Outpatient Age: 76 Room: [facility] ENDO ROOM 1 Gender: Male Note Status: Finalized Procedure:            Colonoscopy Indications:          Personal history of colonic polyps Providers:            Lollie Sails, MD Referring MD:         Venia Carbon (Referring MD) Medicines:            Monitored Anesthesia Care Complications:        No immediate complications. Estimated blood loss: None. Procedure:            Pre-Anesthesia Assessment:                       - ASA Grade Assessment: II - A patient with mild                        systemic disease.                       After obtaining informed consent, the colonoscope was                        passed under direct vision. Throughout the procedure,                        the patient's blood pressure, pulse, and oxygen                        saturations were monitored continuously. The                        Colonoscope was introduced through the anus and                        advanced to the the cecum, identified by appendiceal                        orifice and ileocecal valve. The colonoscopy was                        performed without difficulty. The patient tolerated the                        procedure well. The quality of the bowel preparation                        was good. Findings:      A few small-mouthed diverticula were found in the sigmoid colon and       descending colon.      Non-bleeding internal hemorrhoids were found during anoscopy. The       hemorrhoids were small and Grade I (internal hemorrhoids that do not       prolapse).      The digital rectal exam was normal. Impression:           - Diverticulosis in the sigmoid colon and in the  descending colon.                       - Non-bleeding internal  hemorrhoids.                       - No specimens collected. Recommendation:       - Discharge patient to home.                       - Repeat colonoscopy in 5 years for surveillance. Procedure Code(s):    --- Professional ---                       [id number], Colonoscopy, flexible; diagnostic, including                        collection of specimen(s) by brushing or washing, when                        performed (separate procedure) Diagnosis Code(s):    --- Professional ---                       K64.0, First degree hemorrhoids                       Z86.010, Personal history of colonic polyps                       K57.30, Diverticulosis of large intestine without                        perforation or abscess without bleeding CPT copyright 2016 American Medical Association. All rights reserved. The codes documented in this report are preliminary and upon coder review may  be revised to meet current compliance requirements. Lollie Sails, MD 01/06/2017 11:20:14 AM This report has been signed electronically. Number of Addenda: 0 Note Initiated On: 01/06/2017 10:50 AM Scope Withdrawal Time: 0 hours 8 minutes 51 seconds  Total Procedure Duration: 0 hours 18 minutes 10 seconds       [facility]

## 2017-01-07 ENCOUNTER — Encounter: Payer: Self-pay | Admitting: Gastroenterology

## 2017-01-07 NOTE — Anesthesia Postprocedure Evaluation (Signed)
Anesthesia Post Note  Patient: Brendan Barnes  Procedure(s) Performed: Procedure(s) (LRB): COLONOSCOPY WITH PROPOFOL (N/A)  Patient location during evaluation: PACU Anesthesia Type: General Level of consciousness: awake and alert Pain management: pain level controlled Vital Signs Assessment: post-procedure vital signs reviewed and stable Respiratory status: spontaneous breathing, nonlabored ventilation, respiratory function stable and patient connected to nasal cannula oxygen Cardiovascular status: blood pressure returned to baseline and stable Postop Assessment: no signs of nausea or vomiting Anesthetic complications: no     Last Vitals:  Vitals:   01/06/17 1143 01/06/17 1153  BP: 132/74 (!) 145/85  Pulse: 62 (!) 57  Resp: 17 19  Temp:      Last Pain:  Vitals:   01/07/17 0729  TempSrc:   PainSc: 0-No pain                 Molli Barrows

## 2017-06-11 DIAGNOSIS — L570 Actinic keratosis: Secondary | ICD-10-CM | POA: Diagnosis not present

## 2017-06-11 DIAGNOSIS — D225 Melanocytic nevi of trunk: Secondary | ICD-10-CM | POA: Diagnosis not present

## 2017-06-11 DIAGNOSIS — X32XXXA Exposure to sunlight, initial encounter: Secondary | ICD-10-CM | POA: Diagnosis not present

## 2017-06-11 DIAGNOSIS — D2262 Melanocytic nevi of left upper limb, including shoulder: Secondary | ICD-10-CM | POA: Diagnosis not present

## 2017-06-11 DIAGNOSIS — Z85828 Personal history of other malignant neoplasm of skin: Secondary | ICD-10-CM | POA: Diagnosis not present

## 2017-06-11 DIAGNOSIS — L218 Other seborrheic dermatitis: Secondary | ICD-10-CM | POA: Diagnosis not present

## 2017-06-30 DIAGNOSIS — J3489 Other specified disorders of nose and nasal sinuses: Secondary | ICD-10-CM | POA: Diagnosis not present

## 2017-06-30 DIAGNOSIS — J309 Allergic rhinitis, unspecified: Secondary | ICD-10-CM | POA: Diagnosis not present

## 2017-07-30 ENCOUNTER — Encounter: Payer: Medicare Other | Admitting: Internal Medicine

## 2017-08-07 ENCOUNTER — Other Ambulatory Visit: Payer: Self-pay | Admitting: Internal Medicine

## 2017-11-05 ENCOUNTER — Other Ambulatory Visit: Payer: Self-pay | Admitting: Internal Medicine

## 2017-11-07 DIAGNOSIS — R972 Elevated prostate specific antigen [PSA]: Secondary | ICD-10-CM | POA: Diagnosis not present

## 2017-11-07 DIAGNOSIS — Z8042 Family history of malignant neoplasm of prostate: Secondary | ICD-10-CM | POA: Diagnosis not present

## 2017-11-07 DIAGNOSIS — N403 Nodular prostate with lower urinary tract symptoms: Secondary | ICD-10-CM | POA: Diagnosis not present

## 2017-11-07 DIAGNOSIS — N138 Other obstructive and reflux uropathy: Secondary | ICD-10-CM | POA: Diagnosis not present

## 2017-12-17 DIAGNOSIS — H2513 Age-related nuclear cataract, bilateral: Secondary | ICD-10-CM | POA: Diagnosis not present

## 2018-02-03 ENCOUNTER — Telehealth: Payer: Self-pay | Admitting: Internal Medicine

## 2018-02-03 NOTE — Telephone Encounter (Signed)
Pt has not been seen since September 2017, how much medication would you like to give?

## 2018-02-03 NOTE — Telephone Encounter (Signed)
Approved: Give 6 months and make sure he sets up appt He has been a long time patient and a straight shooter

## 2018-02-03 NOTE — Telephone Encounter (Signed)
I spoke to patient and scheduled appointment on 05/15/18.

## 2018-02-03 NOTE — Telephone Encounter (Signed)
Rxs sent. Brendan Barnes, can you help him get set up for a [facility]W in the next 6 months? Thanks

## 2018-02-03 NOTE — Telephone Encounter (Signed)
Thank you :)

## 2018-05-15 ENCOUNTER — Ambulatory Visit (INDEPENDENT_AMBULATORY_CARE_PROVIDER_SITE_OTHER): Payer: Medicare Other | Admitting: Internal Medicine

## 2018-05-15 ENCOUNTER — Encounter: Payer: Self-pay | Admitting: Internal Medicine

## 2018-05-15 VITALS — BP 132/72 | HR 61 | Temp 97.9°F | Ht 70.5 in | Wt 208.0 lb

## 2018-05-15 DIAGNOSIS — G629 Polyneuropathy, unspecified: Secondary | ICD-10-CM

## 2018-05-15 DIAGNOSIS — Z0000 Encounter for general adult medical examination without abnormal findings: Secondary | ICD-10-CM | POA: Diagnosis not present

## 2018-05-15 DIAGNOSIS — N400 Enlarged prostate without lower urinary tract symptoms: Secondary | ICD-10-CM | POA: Diagnosis not present

## 2018-05-15 DIAGNOSIS — E785 Hyperlipidemia, unspecified: Secondary | ICD-10-CM | POA: Diagnosis not present

## 2018-05-15 DIAGNOSIS — M47815 Spondylosis without myelopathy or radiculopathy, thoracolumbar region: Secondary | ICD-10-CM

## 2018-05-15 DIAGNOSIS — Z7189 Other specified counseling: Secondary | ICD-10-CM | POA: Diagnosis not present

## 2018-05-15 DIAGNOSIS — I10 Essential (primary) hypertension: Secondary | ICD-10-CM | POA: Diagnosis not present

## 2018-05-15 DIAGNOSIS — Z23 Encounter for immunization: Secondary | ICD-10-CM

## 2018-05-15 LAB — COMPREHENSIVE METABOLIC PANEL
ALBUMIN: 4.1 g/dL (ref 3.5–5.2)
ALK PHOS: 64 U/L (ref 39–117)
ALT: 34 U/L (ref 0–53)
AST: 23 U/L (ref 0–37)
BUN: 15 mg/dL (ref 6–23)
CALCIUM: 9.4 mg/dL (ref 8.4–10.5)
CHLORIDE: 93 meq/L — AB (ref 96–112)
CO2: 33 mEq/L — ABNORMAL HIGH (ref 19–32)
Creatinine, Ser: 0.83 mg/dL (ref 0.40–1.50)
GFR: 95.44 mL/min (ref >60.00)
Glucose, Bld: 329 mg/dL — ABNORMAL HIGH (ref 70–99)
POTASSIUM: 3.2 meq/L — AB (ref 3.5–5.1)
SODIUM: 133 meq/L — AB (ref 135–145)
TOTAL PROTEIN: 7 g/dL (ref 6.0–8.3)
Total Bilirubin: 1.7 mg/dL — ABNORMAL HIGH (ref 0.2–1.2)

## 2018-05-15 LAB — CBC
HEMATOCRIT: 41.2 % (ref 39.0–52.0)
Hemoglobin: 14.5 g/dL (ref 13.0–17.0)
MCHC: 35.3 g/dL (ref 30.0–36.0)
MCV: 86.5 fl (ref 78.0–100.0)
PLATELETS: 188 10*3/uL (ref 150.0–400.0)
RBC: 4.76 Mil/uL (ref 4.22–5.81)
RDW: 13.1 % (ref 11.5–15.5)
WBC: 7 10*3/uL (ref 4.0–10.5)

## 2018-05-15 LAB — LIPID PANEL
CHOLESTEROL: 138 mg/dL (ref 0–200)
HDL: 44.4 mg/dL (ref >39.00)
LDL Cholesterol: 57 mg/dL (ref 0–99)
NonHDL: 93.81
TRIGLYCERIDES: 182 mg/dL — AB (ref 0.0–149.0)
Total CHOL/HDL Ratio: 3
VLDL: 36.4 mg/dL (ref 0.0–40.0)

## 2018-05-15 LAB — T4, FREE: FREE T4: 1.02 ng/dL (ref 0.60–1.60)

## 2018-05-15 MED ORDER — LISINOPRIL-HYDROCHLOROTHIAZIDE 20-25 MG PO TABS: 1.0000 | ORAL_TABLET | Freq: Every day | ORAL | 3 refills | Status: DC

## 2018-05-15 MED ORDER — POTASSIUM CHLORIDE CRYS ER 20 MEQ PO TBCR: 20.0000 meq | EXTENDED_RELEASE_TABLET | Freq: Two times a day (BID) | ORAL | 3 refills | Status: DC

## 2018-05-15 MED ORDER — FINASTERIDE 5 MG PO TABS: 5.0000 mg | ORAL_TABLET | Freq: Every day | ORAL | 3 refills | Status: DC

## 2018-05-15 MED ORDER — DICLOFENAC SODIUM 75 MG PO TBEC: 75.0000 mg | DELAYED_RELEASE_TABLET | ORAL | 0 refills | Status: DC | PRN

## 2018-05-15 MED ORDER — AMLODIPINE BESYLATE 5 MG PO TABS: 5.0000 mg | ORAL_TABLET | Freq: Every day | ORAL | 3 refills | Status: DC

## 2018-05-15 MED ORDER — SIMVASTATIN 20 MG PO TABS: 20.0000 mg | ORAL_TABLET | Freq: Every day | ORAL | 3 refills | Status: DC

## 2018-05-15 NOTE — Assessment & Plan Note (Signed)
Good control on finasteride

## 2018-05-15 NOTE — Progress Notes (Signed)
Hearing Screening   Method: Audiometry   125Hz  250Hz  500Hz  1000Hz  2000Hz  3000Hz  4000Hz  6000Hz  8000Hz   Right ear:   25 40 25  0    Left ear:   20 20 20   0    Vision Screening Comments: February 2019

## 2018-05-15 NOTE — Assessment & Plan Note (Signed)
BP Readings from Last 3 Encounters:  05/15/18 132/72  01/06/17 (!) 145/85  07/24/16 132/88   Good control Due for labs

## 2018-05-15 NOTE — Assessment & Plan Note (Addendum)
I have personally reviewed the Medicare Annual Wellness questionnaire and have noted 1. The patient's medical and social history 2. Their use of alcohol, tobacco or illicit drugs 3. Their current medications and supplements 4. The patient's functional ability including ADL's, fall risks, home safety risks and hearing or visual             impairment. 5. Diet and physical activities 6. Evidence for depression or mood disorders  The patients weight, height, BMI and visual acuity have been recorded in the chart I have made referrals, counseling and provided education to the patient based review of the above and I have provided the pt with a written personalized care plan for preventive services.  I have provided you with a copy of your personalized plan for preventive services. Please take the time to review along with your updated medication list.  He will look into the shingrix Yearly flu vaccine Discussed increasing exercise Just had colon--last screening one No more PSA Pneumovax booster today

## 2018-05-15 NOTE — Assessment & Plan Note (Signed)
Uses the NSAID only very rarely

## 2018-05-15 NOTE — Progress Notes (Signed)
Subjective:    Patient ID: Brendan Barnes, male    DOB: 12/18/1940, 77 y.o.   MRN: [id number]  HPI Here for Medicare wellness visit and follow up of chronic health conditions Reviewed form and advanced directives Reviewed other doctors No alcohol or tobacco No exercise---discussed No falls  No depression or anhedonia---just stress Vision changes with cataracts is worsening--may need to consider surgery. Has chronic dry eye and a "growth" in his eye Drooping of left lower lid---gets mucus there Hearing is not great---affected by background noise (congestion affects) No falls Independent with instrumental ADLs No sig memory issues  [place] On first floor --makes it easier for wife (who needs rollator) 10 meals per month on contract Things are "going downhill" He does all the instrumental ADLs--but she does her ADLs  Diagnosed with non allergic rhinitis On azelastine now--thinks it gives him some headaches Always stopped up in left sinuses  Voiding okay on finasteride No nocturia and no sig daytime symptoms Dr Brendan Barnes left---discussed (I can Rx med)  Still has numbness in feet Not taking B12 now  No chest pain No SOB No dizziness or syncope No edema No palpitations  Still on statin No myalgia or GI problems with this  Uses the diclofenac prn---not often (not even weekly) For back pain  Current Outpatient Medications on File Prior to Visit  Medication Sig Dispense Refill  . amLODipine (NORVASC) 5 MG tablet TAKE 1 TABLET DAILY ( NEED TO SCHEDULE ANNUAL EXAM ) 90 tablet 1  . Azelastine HCl 137 MCG/SPRAY SOLN     . diclofenac (VOLTAREN) 75 MG EC tablet Take 75 mg by mouth as needed.    . finasteride (PROSCAR) 5 MG tablet Take 5 mg by mouth daily.    Marland Kitchen KLOR-CON M20 20 MEQ tablet TAKE 1 TABLET TWICE A DAY ( NEED TO SCHEDULE ANNUAL EXAM ) 180 tablet 1  . lisinopril-hydrochlorothiazide (PRINZIDE,ZESTORETIC) 20-25 MG tablet TAKE 1 TABLET DAILY  ( NEED TO SCHEDULE ANNUAL EXAM ) 90 tablet 1  . polyethylene glycol (MIRALAX / GLYCOLAX) packet Take 17 g by mouth 3 (three) times a week.    . simvastatin (ZOCOR) 20 MG tablet TAKE 1 TABLET DAILY ( NEED TO SCHEDULE ANNUAL EXAM ) 90 tablet 1   No current facility-administered medications on file prior to visit.     No Known Allergies  Past Medical History:  Diagnosis Date  . BPH (benign prostatic hypertrophy)   . ED (erectile dysfunction)   . HLD (hyperlipidemia)   . HTN (hypertension)   . IBS (irritable bowel syndrome)   . Osteoarthritis     Past Surgical History:  Procedure Laterality Date  . COLONOSCOPY WITH PROPOFOL N/A 01/06/2017   Procedure: COLONOSCOPY WITH PROPOFOL;  Surgeon: Brendan Sails, MD;  Location: [facility] ENDOSCOPY;  Service: Endoscopy;  Laterality: N/A;  . nasal "growth"  2000  . toenail removal    . US ECHOCARDIOGRAPHY  06/2005   LV EF normal, Mild LVH, LAE    Family History  Problem Relation Age of Onset  . Prostate cancer Father        prostate  . Hyperlipidemia Father   . Arrhythmia Father   . Kidney failure Mother   . Heart failure Mother   . Gout Mother   . Scoliosis Son     Social History   Socioeconomic History  . Marital status: Married    Spouse name: Not on file  . Number of children: 2  .  Years of education: Not on file  . Highest education level: Not on file  Occupational History  . 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 Financial resource strain: Not on file  . Food insecurity:    Worry: Not on file    Inability: Not on file  . Transportation needs:    Medical: Not on file    Non-medical: Not on file  Tobacco Use  . Smoking status: Never Smoker  . Smokeless tobacco: Never Used  Substance and Sexual Activity  . Alcohol use: Yes    Comment: occasional  . Drug use: No  . Sexual activity: Not on file  Lifestyle  . Physical activity:    Days per week: Not on file    Minutes per session: Not on  file  . Stress: Not on file  Relationships  . Social connections:    Talks on phone: Not on file    Gets together: Not on file    Attends religious service: Not on file    Active member of club or organization: Not on file    Attends meetings of clubs or organizations: Not on file    Relationship status: Not on file  . Intimate partner violence:    Fear of current or ex partner: Not on file    Emotionally abused: Not on file    Physically abused: Not on file    Forced sexual activity: Not on file  Other Topics Concern  . Not on file  Social History Narrative   Has living will   Wife Brendan Barnes is his health care POA-- alternate is son Brendan Barnes   Would accept resuscitation attempts   Might accept tube feeds based on situation   Review of Systems Appetite is okay Has lost 12#--- no specific efforts Sleeps okay in general--does have some night awakening. Takes afternoon nap Wears seat belt Teeth are okay. Keeps up with dentist Bowels are okay--no blood. Uses miralax No heartburn or swallowing problems Keeps up with derm---skin cancers, etc    Objective:   Physical Exam  Constitutional: He is oriented to person, place, and time.  Cardiovascular: Normal rate, regular rhythm and normal heart sounds. Exam reveals no gallop.  No murmur heard. Faint pedal pulses  Respiratory: Effort normal and breath sounds normal. No respiratory distress. He has no wheezes. He has no rales.  GI: Soft. There is no tenderness.  Musculoskeletal: He exhibits no tenderness.  Trace edema and some dilated veins in feet/calves  Neurological: He is alert and oriented to person, place, and time.  President--- "Brendan Barnes, Brendan Barnes, Brendan Barnes" [phone] D-l-o-r-w Recall 3/3  Skin: No rash noted.  Psychiatric: He has a normal mood and affect.           Assessment & Plan:

## 2018-05-15 NOTE — Addendum Note (Signed)
Addended by: Pilar Grammes on: 05/15/2018 12:55 PM   Modules accepted: Orders

## 2018-05-15 NOTE — Assessment & Plan Note (Signed)
See social history 

## 2018-05-15 NOTE — Assessment & Plan Note (Signed)
Mild and non progressive

## 2018-05-15 NOTE — Assessment & Plan Note (Signed)
Okay with primary prevention 

## 2018-05-19 ENCOUNTER — Ambulatory Visit (INDEPENDENT_AMBULATORY_CARE_PROVIDER_SITE_OTHER): Payer: Medicare Other | Admitting: Internal Medicine

## 2018-05-19 ENCOUNTER — Encounter: Payer: Self-pay | Admitting: Internal Medicine

## 2018-05-19 VITALS — BP 130/86 | HR 62 | Temp 98.1°F | Ht 70.5 in | Wt 207.0 lb

## 2018-05-19 DIAGNOSIS — E1140 Type 2 diabetes mellitus with diabetic neuropathy, unspecified: Secondary | ICD-10-CM | POA: Insufficient documentation

## 2018-05-19 DIAGNOSIS — E119 Type 2 diabetes mellitus without complications: Secondary | ICD-10-CM

## 2018-05-19 LAB — POCT GLYCOSYLATED HEMOGLOBIN (HGB A1C): HEMOGLOBIN A1C: 9.8 % — AB (ref 4.0–5.6)

## 2018-05-19 MED ORDER — GLUCOSE BLOOD VI STRP: ORAL_STRIP | 3 refills | Status: DC

## 2018-05-19 MED ORDER — METFORMIN HCL ER 500 MG PO TB24: 1000.0000 mg | ORAL_TABLET | Freq: Every day | ORAL | 3 refills | Status: DC

## 2018-05-19 MED ORDER — FREESTYLE FREEDOM LITE W/DEVICE KIT: 1.0000 | PACK | Freq: Once | 0 refills | Status: AC

## 2018-05-19 NOTE — Progress Notes (Signed)
Subjective:    Patient ID: Brendan Barnes, male    DOB: Mar 26, 1941, 77 y.o.   MRN: [id number]  HPI Here due to new onset of diabetes  Not sure why this happened now--- elevated sugars for many years No FH No change in diet or weight gain  Has used sugared drinks to mask taste of miralax (juice) Has cut this out Eats out all the time---knows he has to adjust due to this  He did go through the lifestyle center at [facility] with wife Knows what he should do  Current Outpatient Medications on File Prior to Visit  Medication Sig Dispense Refill  . amLODipine (NORVASC) 5 MG tablet Take 1 tablet (5 mg total) by mouth daily. 90 tablet 3  . Azelastine HCl 137 MCG/SPRAY SOLN     . diclofenac (VOLTAREN) 75 MG EC tablet Take 1 tablet (75 mg total) by mouth as needed. 90 tablet 0  . finasteride (PROSCAR) 5 MG tablet Take 1 tablet (5 mg total) by mouth daily. 90 tablet 3  . KRILL OIL PO Take by mouth.    Marland Kitchen lisinopril-hydrochlorothiazide (PRINZIDE,ZESTORETIC) 20-25 MG tablet Take 1 tablet by mouth daily. 90 tablet 3  . Multiple Vitamin (MULTIVITAMIN) tablet Take 1 tablet by mouth daily.    . polyethylene glycol (MIRALAX / GLYCOLAX) packet Take 17 g by mouth 3 (three) times a week.    . potassium chloride SA (KLOR-CON M20) 20 MEQ tablet Take 1 tablet (20 mEq total) by mouth 2 (two) times daily. 180 tablet 3  . simvastatin (ZOCOR) 20 MG tablet Take 1 tablet (20 mg total) by mouth daily. 90 tablet 3   No current facility-administered medications on file prior to visit.     No Known Allergies  Past Medical History:  Diagnosis Date  . BPH (benign prostatic hypertrophy)   . ED (erectile dysfunction)   . HLD (hyperlipidemia)   . HTN (hypertension)   . IBS (irritable bowel syndrome)   . Osteoarthritis     Past Surgical History:  Procedure Laterality Date  . COLONOSCOPY WITH PROPOFOL N/A 01/06/2017   Procedure: COLONOSCOPY WITH PROPOFOL;  Surgeon: Lollie Sails, MD;  Location: [facility]  ENDOSCOPY;  Service: Endoscopy;  Laterality: N/A;  . nasal "growth"  2000  . toenail removal    . US ECHOCARDIOGRAPHY  06/2005   LV EF normal, Mild LVH, LAE    Family History  Problem Relation Age of Onset  . Prostate cancer Father        prostate  . Hyperlipidemia Father   . Arrhythmia Father   . Kidney failure Mother   . Heart failure Mother   . Gout Mother   . Scoliosis Son     Social History   Socioeconomic History  . Marital status: Married    Spouse name: Not on file  . Number of children: 2  . Years of education: Not on file  . Highest education level: Not on file  Occupational History  . 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 Financial resource strain: Not on file  . Food insecurity:    Worry: Not on file    Inability: Not on file  . Transportation needs:    Medical: Not on file    Non-medical: Not on file  Tobacco Use  . Smoking status: Never Smoker  . Smokeless tobacco: Never Used  Substance and Sexual Activity  . Alcohol use: Yes    Comment: occasional  .  Drug use: No  . Sexual activity: Not on file  Lifestyle  . Physical activity:    Days per week: Not on file    Minutes per session: Not on file  . Stress: Not on file  Relationships  . Social connections:    Talks on phone: Not on file    Gets together: Not on file    Attends religious service: Not on file    Active member of club or organization: Not on file    Attends meetings of clubs or organizations: Not on file    Relationship status: Not on file  . Intimate partner violence:    Fear of current or ex partner: Not on file    Emotionally abused: Not on file    Physically abused: Not on file    Forced sexual activity: Not on file  Other Topics Concern  . Not on file  Social History Narrative   Has living will   Wife [name] is his health care POA-- alternate is son Dax   Would accept resuscitation attempts   Might accept tube feeds based on situation    Review of Systems No sleep problems Weight stable now    Objective:   Physical Exam  Constitutional: He appears well-developed. No distress.  Psychiatric: He has a normal mood and affect. His behavior is normal.           Assessment & Plan:

## 2018-05-19 NOTE — Patient Instructions (Signed)
Please start the metformin with 1 daily at breakfast. If no problems after a week, increase to 2 tabs daily. Let me know if you are still having fasting sugars over 200 after 2 weeks of medication.

## 2018-05-19 NOTE — Assessment & Plan Note (Addendum)
Has had the diabetic counseling---knows what he should be doing Will start metformin 500, then increase to 1000 Goal is to get A1c under 8% Discussed daily fasting sugars at first---then can cut back Let me know if fasting sugars stay over 200 within 2 weeks Counseled all of 15 minute visit about treatment plan

## 2018-05-19 NOTE — Addendum Note (Signed)
Addended by: Pilar Grammes on: 05/19/2018 12:08 PM   Modules accepted: Orders

## 2018-05-20 ENCOUNTER — Other Ambulatory Visit: Payer: Self-pay | Admitting: Internal Medicine

## 2018-05-20 MED ORDER — GLUCOSE BLOOD VI STRP: ORAL_STRIP | 3 refills | Status: DC

## 2018-05-20 MED ORDER — FREESTYLE FREEDOM LITE W/DEVICE KIT: 1.0000 | PACK | Freq: Once | 0 refills | Status: AC

## 2018-05-20 MED ORDER — FREESTYLE FREEDOM LITE W/DEVICE KIT: 1.0000 | PACK | Freq: Once | 0 refills | Status: DC

## 2018-05-20 NOTE — Telephone Encounter (Signed)
Left message to call office. We already sent the rx to the pharmacy after his OV. Is he wanting to shop around?

## 2018-05-20 NOTE — Telephone Encounter (Signed)
Pt wants the meter printed and the strips to [facility]. This has been all been done.

## 2018-05-20 NOTE — Telephone Encounter (Signed)
Copied from [place] [id number]. Topic: Quick Communication - See Telephone Encounter >> May 20, 2018 11:42 AM Percell Belt A wrote: CRM for notification. See Telephone encounter for: 05/20/18.  Pt called in and stated that he is needing the glucose blood (FREESTYLE LITE) meter , he would like to come by office to pick up paper script  for this meter. Pt is coming by today just fyi?  He would like a call back when it is ready  Best number  [phone] Cell number [phone]

## 2018-06-03 ENCOUNTER — Other Ambulatory Visit: Payer: Self-pay

## 2018-06-10 DIAGNOSIS — D2262 Melanocytic nevi of left upper limb, including shoulder: Secondary | ICD-10-CM | POA: Diagnosis not present

## 2018-06-10 DIAGNOSIS — D2272 Melanocytic nevi of left lower limb, including hip: Secondary | ICD-10-CM | POA: Diagnosis not present

## 2018-06-10 DIAGNOSIS — D2261 Melanocytic nevi of right upper limb, including shoulder: Secondary | ICD-10-CM | POA: Diagnosis not present

## 2018-06-10 DIAGNOSIS — X32XXXA Exposure to sunlight, initial encounter: Secondary | ICD-10-CM | POA: Diagnosis not present

## 2018-06-10 DIAGNOSIS — L249 Irritant contact dermatitis, unspecified cause: Secondary | ICD-10-CM | POA: Diagnosis not present

## 2018-06-10 DIAGNOSIS — L918 Other hypertrophic disorders of the skin: Secondary | ICD-10-CM | POA: Diagnosis not present

## 2018-06-10 DIAGNOSIS — L570 Actinic keratosis: Secondary | ICD-10-CM | POA: Diagnosis not present

## 2018-06-10 DIAGNOSIS — D2271 Melanocytic nevi of right lower limb, including hip: Secondary | ICD-10-CM | POA: Diagnosis not present

## 2018-06-15 ENCOUNTER — Telehealth: Payer: Self-pay | Admitting: Internal Medicine

## 2018-06-15 MED ORDER — METFORMIN HCL ER 500 MG PO TB24: 1000.0000 mg | ORAL_TABLET | Freq: Every day | ORAL | 3 refills | Status: DC

## 2018-06-15 NOTE — Telephone Encounter (Signed)
Copied from [place] [id number]. Topic: Quick Communication - Rx Refill/Question >> Jun 15, 2018  9:21 AM Reyne Dumas L wrote: Medication: metFORMIN (GLUCOPHAGE-XR) 500 MG 24 hr tablet  Has the patient contacted their pharmacy? No - pt wants to use a new pharmacy (Agent: If no, request that the patient contact the pharmacy for the refill.) (Agent: If yes, when and what did the pharmacy advise?)  Preferred Pharmacy (with phone number or street name): [place], [place] [address] [place] [address] Phone: [phone] Fax: [phone]  Agent: Please be advised that RX refills may take up to 3 business days. We ask that you follow-up with your pharmacy.

## 2018-07-14 ENCOUNTER — Other Ambulatory Visit: Payer: Self-pay | Admitting: Internal Medicine

## 2018-07-14 NOTE — Telephone Encounter (Signed)
Will route to office for final disposition; LOV 05/19/18 by Dr Silvio Pate.

## 2018-07-14 NOTE — Telephone Encounter (Signed)
Copied from [place] [id number]. Topic: Quick Communication - Rx Refill/Question >> Jul 14, 2018 12:34 PM Sheppard Coil, [employer] L wrote: Medication: metFORMIN (GLUCOPHAGE-XR) 500 MG 24 hr tablet  Has the patient contacted their pharmacy? No - would like this sent to mail order for a 90 day supply (Agent: If no, request that the patient contact the pharmacy for the refill.) (Agent: If yes, when and what did the pharmacy advise?)  Preferred Pharmacy (with phone number or street name): [place], [place] [phone] (Phone) [phone] (Fax)  Agent: Please be advised that RX refills may take up to 3 business days. We ask that you follow-up with your pharmacy.

## 2018-07-14 NOTE — Telephone Encounter (Signed)
I spoke with pt and he only has 3 days of metformin left and will get one month at local pharmacy and about 1/2 way thru that rx will cb to get metformin sent to mail order.

## 2018-08-05 DIAGNOSIS — E119 Type 2 diabetes mellitus without complications: Secondary | ICD-10-CM | POA: Diagnosis not present

## 2018-08-05 DIAGNOSIS — H40003 Preglaucoma, unspecified, bilateral: Secondary | ICD-10-CM | POA: Diagnosis not present

## 2018-08-06 ENCOUNTER — Encounter: Payer: Self-pay | Admitting: Internal Medicine

## 2018-08-06 LAB — HM DIABETES EYE EXAM

## 2018-08-17 ENCOUNTER — Other Ambulatory Visit: Payer: Self-pay | Admitting: Internal Medicine

## 2018-08-17 NOTE — Telephone Encounter (Signed)
Copied from [place] [id number]. Topic: Quick Communication - Rx Refill/Question >> Aug 17, 2018 12:14 PM Burchel, Abbi R wrote: Medication: metFORMIN (GLUCOPHAGE-XR) 500 MG 24 hr tablet   Preferred Pharmacy: [place], [place] [address] Phone: [phone] Fax: [phone]  Please note, pt is completely out of this medication. Pt was advised that RX refills may take up to 3 business days. We ask that you follow-up with your pharmacy.

## 2018-08-18 NOTE — Telephone Encounter (Signed)
I spoke with pt; pt is out of metformin and does not think he has available [facility]ills [facility] but pt has appt with Dr Silvio Pate on 08/26/18 as FU appt and pt said he will wait to transfer to mail order until sees Dr Silvio Pate on 08/26/18 and is assured of metformin dosage. I [facility] with [facility] at [facility] and pt has available refills and will get refill ready for pick up. Pt voiced understanding and is appreciative for call.

## 2018-08-21 ENCOUNTER — Telehealth: Payer: Self-pay | Admitting: *Deleted

## 2018-08-21 DIAGNOSIS — E119 Type 2 diabetes mellitus without complications: Secondary | ICD-10-CM

## 2018-08-21 NOTE — Telephone Encounter (Signed)
Tried to call pt to let him know about the referral. Left message to call office. If pt calls back, please advise him a referral was started.

## 2018-08-21 NOTE — Telephone Encounter (Signed)
Referral made Let him know he should hear from them fairly soon

## 2018-08-21 NOTE — Telephone Encounter (Signed)
Copied from [place] [id number]. Topic: Referral - Request for Referral >> Aug 21, 2018 10:53 AM Hewitt Shorts wrote: Has patient seen PCP for this complaint?yes *If NO, is insurance requiring patient see PCP for this issue before PCP can refer them? Referral for which specialty: nutrition for diabetic Preferred provider/office:  Reason for referral: pt is wanting to go ahead with a referral to a nutritionist as to what him and Dr. Silvio Pate has discussed in the past [phone]

## 2018-08-25 ENCOUNTER — Ambulatory Visit: Payer: Medicare Other | Admitting: Internal Medicine

## 2018-08-26 ENCOUNTER — Encounter: Payer: Self-pay | Admitting: Internal Medicine

## 2018-08-26 ENCOUNTER — Ambulatory Visit (INDEPENDENT_AMBULATORY_CARE_PROVIDER_SITE_OTHER): Payer: Medicare Other | Admitting: Internal Medicine

## 2018-08-26 VITALS — BP 132/86 | HR 49 | Temp 97.5°F | Ht 71.0 in | Wt 198.0 lb

## 2018-08-26 DIAGNOSIS — E1140 Type 2 diabetes mellitus with diabetic neuropathy, unspecified: Secondary | ICD-10-CM | POA: Diagnosis not present

## 2018-08-26 LAB — POCT GLYCOSYLATED HEMOGLOBIN (HGB A1C): HEMOGLOBIN A1C: 5.6 % (ref 4.0–5.6)

## 2018-08-26 LAB — HM DIABETES FOOT EXAM

## 2018-08-26 MED ORDER — METFORMIN HCL ER 500 MG PO TB24: 1000.0000 mg | ORAL_TABLET | Freq: Every day | ORAL | 3 refills | Status: DC

## 2018-08-26 NOTE — Progress Notes (Signed)
Subjective:    Patient ID: Brendan Barnes, male    DOB: 29-Aug-1941, 77 y.o.   MRN: [id number]  HPI Here for follow up of diabetes  Has been working on healthier eating He has lost 10# Feels better Hasn't been able to do much exercise---due to back pain  Checking sugars every morning 117 this morning Average now is ~120 Rare under 100  No problems with metformin  Current Outpatient Medications on File Prior to Visit  Medication Sig Dispense Refill  . amLODipine (NORVASC) 5 MG tablet Take 1 tablet (5 mg total) by mouth daily. 90 tablet 3  . Azelastine HCl 137 MCG/SPRAY SOLN     . diclofenac (VOLTAREN) 75 MG EC tablet Take 1 tablet (75 mg total) by mouth as needed. 90 tablet 0  . finasteride (PROSCAR) 5 MG tablet Take 1 tablet (5 mg total) by mouth daily. 90 tablet 3  . glucose blood (FREESTYLE LITE) test strip Use to check blood sugar once daily. Dx Code E11.9 100 each 3  . KRILL OIL PO Take by mouth.    Marland Kitchen lisinopril-hydrochlorothiazide (PRINZIDE,ZESTORETIC) 20-25 MG tablet Take 1 tablet by mouth daily. 90 tablet 3  . metFORMIN (GLUCOPHAGE-XR) 500 MG 24 hr tablet Take 2 tablets (1,000 mg total) by mouth daily with breakfast. 60 tablet 3  . Multiple Vitamin (MULTIVITAMIN) tablet Take 1 tablet by mouth daily.    . polyethylene glycol (MIRALAX / GLYCOLAX) packet Take 17 g by mouth 3 (three) times a week.    . potassium chloride SA (KLOR-CON M20) 20 MEQ tablet Take 1 tablet (20 mEq total) by mouth 2 (two) times daily. 180 tablet 3  . simvastatin (ZOCOR) 20 MG tablet Take 1 tablet (20 mg total) by mouth daily. 90 tablet 3   No current facility-administered medications on file prior to visit.     No Known Allergies  Past Medical History:  Diagnosis Date  . BPH (benign prostatic hypertrophy)   . ED (erectile dysfunction)   . HLD (hyperlipidemia)   . HTN (hypertension)   . IBS (irritable bowel syndrome)   . Osteoarthritis     Past Surgical History:  Procedure Laterality  Date  . COLONOSCOPY WITH PROPOFOL N/A 01/06/2017   Procedure: COLONOSCOPY WITH PROPOFOL;  Surgeon: Lollie Sails, MD;  Location: [facility] ENDOSCOPY;  Service: Endoscopy;  Laterality: N/A;  . nasal "growth"  2000  . toenail removal    . US ECHOCARDIOGRAPHY  06/2005   LV EF normal, Mild LVH, LAE    Family History  Problem Relation Age of Onset  . Prostate cancer Father        prostate  . Hyperlipidemia Father   . Arrhythmia Father   . Kidney failure Mother   . Heart failure Mother   . Gout Mother   . Scoliosis Son     Social History   Socioeconomic History  . Marital status: Married    Spouse name: Not on file  . Number of children: 2  . Years of education: Not on file  . Highest education level: Not on file  Occupational History  . 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 Financial resource strain: Not on file  . Food insecurity:    Worry: Not on file    Inability: Not on file  . Transportation needs:    Medical: Not on file    Non-medical: Not on file  Tobacco Use  . Smoking status: Never Smoker  .  Smokeless tobacco: Never Used  Substance and Sexual Activity  . Alcohol use: Yes    Comment: occasional  . Drug use: No  . Sexual activity: Not on file  Lifestyle  . Physical activity:    Days per week: Not on file    Minutes per session: Not on file  . Stress: Not on file  Relationships  . Social connections:    Talks on phone: Not on file    Gets together: Not on file    Attends religious service: Not on file    Active member of club or organization: Not on file    Attends meetings of clubs or organizations: Not on file    Relationship status: Not on file  . Intimate partner violence:    Fear of current or ex partner: Not on file    Emotionally abused: Not on file    Physically abused: Not on file    Forced sexual activity: Not on file  Other Topics Concern  . Not on file  Social History Narrative   Has living will   Wife [name]  is his health care POA-- alternate is son Mayco   Would accept resuscitation attempts   Might accept tube feeds based on situation   Review of Systems  Using azelastine spray at night (from Dr Nelva Bush knocks him out and helps him sleep Still with left sinus congestion No foot sores or numbness--right 4th toe pain--using padding Bowels are okay     Objective:   Physical Exam  Constitutional: He appears well-developed. No distress.  Neck: No thyromegaly present.  Cardiovascular: Normal rate and normal heart sounds. Exam reveals no gallop.  No murmur heard. Mostly trigeminy Faint pedal pulses  Respiratory: Effort normal and breath sounds normal. No respiratory distress. He has no wheezes. He has no rales.  Musculoskeletal: He exhibits no edema.  Lymphadenopathy:    He has no cervical adenopathy.  Neurological:  Chronic abnormal sensation in feet  Skin: No rash noted.  No foot lesions           Assessment & Plan:

## 2018-08-26 NOTE — Assessment & Plan Note (Addendum)
Seems to have good control now Will check A1c Chronic neuropathy--not sure if some component from the DM as well Lab Results  Component Value Date   HGBA1C 5.6 08/26/2018   Great response Referral made to [facility] for information about this

## 2018-09-16 ENCOUNTER — Encounter: Payer: Self-pay | Admitting: *Deleted

## 2018-09-16 ENCOUNTER — Encounter: Payer: Medicare Other | Attending: Internal Medicine | Admitting: *Deleted

## 2018-09-16 VITALS — BP 150/80 | Ht 71.0 in | Wt 197.6 lb

## 2018-09-16 DIAGNOSIS — E119 Type 2 diabetes mellitus without complications: Secondary | ICD-10-CM | POA: Diagnosis not present

## 2018-09-16 DIAGNOSIS — Z713 Dietary counseling and surveillance: Secondary | ICD-10-CM | POA: Diagnosis not present

## 2018-09-16 NOTE — Patient Instructions (Addendum)
Check blood sugars 1 x day before breakfast or 2 hrs after one meal  2 x week Bring blood sugar records to the next appointment  Exercise:  Walk as tolerated  Eat 3 meals day,  1  snack a day Space meals 4-6 hours apart Don't skip meals Complete 3 Day Food Record and bring to next appt  Return for appointment on:  Wednesday October 07, 2018 at 9:30 am with [facility] (dietitian)

## 2018-09-16 NOTE — Progress Notes (Signed)
Diabetes Self-Management Education  Visit Type: First/Initial  Appt. Start Time: 1340 Appt. End Time: 4166  09/16/2018  Mr. Brendan Barnes, identified by name and date of birth, is a 77 y.o. male with a diagnosis of Diabetes: Type 2.   ASSESSMENT  Blood pressure (!) 150/80, height 5\' 11"  (1.803 m), weight 197 lb 9.6 oz (89.6 kg). Body mass index is 27.56 kg/m.  Diabetes Self-Management Education - 09/16/18 1509      Visit Information   Visit Type  First/Initial      Initial Visit   Diabetes Type  Type 2    Are you currently following a meal plan?  Yes    What type of meal plan do you follow?  "cut out sweets"    Are you taking your medications as prescribed?  Yes    Date Diagnosed  July 2019      Health Coping   How would you rate your overall health?  Good      Psychosocial Assessment   Patient Belief/Attitude about Diabetes  Motivated to manage diabetes   "no change"   Self-care barriers  None    Self-management support  Doctor's office;Family    Patient Concerns  Nutrition/Meal planning;Monitoring    Special Needs  None    Preferred Learning Style  Visual    Learning Readiness  Ready    How often do you need to have someone help you when you read instructions, pamphlets, or other written materials from your doctor or pharmacy?  1 - Never    What is the last grade level you completed in school?  college      Pre-Education Assessment   Patient understands the diabetes disease and treatment process.  Needs Instruction    Patient understands incorporating nutritional management into lifestyle.  Needs Instruction    Patient undertands incorporating physical activity into lifestyle.  Needs Instruction    Patient understands using medications safely.  Needs Instruction    Patient understands monitoring blood glucose, interpreting and using results  Needs Review    Patient understands prevention, detection, and treatment of acute complications.  Needs Instruction    Patient understands prevention, detection, and treatment of chronic complications.  Needs Instruction    Patient understands how to develop strategies to address psychosocial issues.  Needs Instruction    Patient understands how to develop strategies to promote health/change behavior.  Needs Instruction      Complications   Last HgB A1C per patient/outside source  5.6 %   08/26/18 - 9.8 % on 05/19/18   How often do you check your blood sugar?  3-4 times / week    Fasting Blood glucose range (mg/dL)  70-129   FBG's 102-128 mg/dL   Have you had a dilated eye exam in the past 12 months?  Yes    Have you had a dental exam in the past 12 months?  Yes    Are you checking your feet?  Yes    How many days per week are you checking your feet?  7      Dietary Intake   Breakfast  cereal with little milk and fruit; egg and sausage biscuit 1 x week    Lunch  eats at [employer] or eats out - bowl of vegetable soup and fruit; sandwich    Dinner  eats at [employer] or eats out - [employer], pork and fries, hamburger with bun, salmon and slaw, steak with baked potato and broccoli;  rice, pasta, geen beans, pinto beans    Beverage(s)  water, black coffee      Exercise   Exercise Type  ADL's   Pt reports he doesn't want to exercise.      Patient Education   Previous Diabetes Education  Yes (please comment)   Pt came with his wife to diabetes classes in 2002.    Disease state   Definition of diabetes, type 1 and 2, and the diagnosis of diabetes;Factors that contribute to the development of diabetes    Nutrition management   Role of diet in the treatment of diabetes and the relationship between the three main macronutrients and blood glucose level;Carbohydrate counting;Food label reading, portion sizes and measuring food.;Reviewed blood glucose goals for pre and post meals and how to evaluate the patients' food intake on their blood glucose level.    Physical activity and exercise    Role of exercise on diabetes management, blood pressure control and cardiac health.    Medications  Reviewed patients medication for diabetes, action, purpose, timing of dose and side effects.    Monitoring  Purpose and frequency of SMBG.;Taught/discussed recording of test results and interpretation of SMBG.;Identified appropriate SMBG and/or A1C goals.    Chronic complications  Relationship between chronic complications and blood glucose control    Psychosocial adjustment  Identified and addressed patients feelings and concerns about diabetes      Individualized Goals (developed by patient)   Reducing Risk Prevent diabetes complications     Outcomes   Expected Outcomes  Demonstrated interest in learning. Expect positive outcomes    Future DMSE  Other (comment)   3 weeks      Individualized Plan for Diabetes Self-Management Training:   Learning Objective:  Patient will have a greater understanding of diabetes self-management. Patient education plan is to attend individual and/or group sessions per assessed needs and concerns.   Plan:   Patient Instructions  Check blood sugars 1 x day before breakfast or 2 hrs after one meal  2 x week Bring blood sugar records to the next appointment Exercise:  Walk as tolerated Eat 3 meals day,  1  snack a day Space meals 4-6 hours apart Don't skip meals Complete 3 Day Food Record and bring to next appt Return for appointment on:  Wednesday October 07, 2018 at 9:30 am with [facility] (dietitian)  Expected Outcomes:  Demonstrated interest in learning. Expect positive outcomes  Education material provided:  General Meal Planning Guidelines Simple Meal Plan 3 Day Food Record  If problems or questions, patient to contact team via:   Johny Drilling, RN, [place], CDE [phone]  Future DSME appointment: (3 weeks) October 07, 2018 with the dietitian

## 2018-09-28 ENCOUNTER — Other Ambulatory Visit: Payer: Self-pay | Admitting: Internal Medicine

## 2018-10-07 ENCOUNTER — Ambulatory Visit: Payer: Medicare Other | Admitting: Dietician

## 2018-10-15 ENCOUNTER — Encounter: Payer: Self-pay | Admitting: Dietician

## 2018-10-15 NOTE — Progress Notes (Signed)
Patient's appointment was rescheduled from 10/07/18 to 10/21/18 due to RD on bereavement leave.

## 2018-10-21 ENCOUNTER — Encounter: Payer: Self-pay | Admitting: Dietician

## 2018-10-21 ENCOUNTER — Encounter: Payer: Medicare Other | Attending: Internal Medicine | Admitting: Dietician

## 2018-10-21 VITALS — BP 160/70 | Ht 71.0 in | Wt 194.5 lb

## 2018-10-21 DIAGNOSIS — E119 Type 2 diabetes mellitus without complications: Secondary | ICD-10-CM | POA: Insufficient documentation

## 2018-10-21 DIAGNOSIS — Z713 Dietary counseling and surveillance: Secondary | ICD-10-CM | POA: Insufficient documentation

## 2018-10-21 NOTE — Progress Notes (Signed)
Diabetes Self-Management Education  Visit Type:  Follow-up  Appt. Start Time: 1030 Appt. End Time: 1110  10/21/2018  Mr. Brendan Barnes, identified by name and date of birth, is a 77 y.o. male with a diagnosis of Diabetes:  .   ASSESSMENT  Blood pressure (!) 160/70, height 5\' 11"  (1.803 m), weight 194 lb 8 oz (88.2 kg). Body mass index is 27.13 kg/m.   Diabetes Self-Management Education - 82/42/35 3614      Complications   Fasting Blood glucose range (mg/dL)  70-129    Have you had a dilated eye exam in the past 12 months?  Yes    Have you had a dental exam in the past 12 months?  Yes    Are you checking your feet?  Yes    How many days per week are you checking your feet?  7      Dietary Intake   Breakfast  2-3 meals and 0-1 snack daily      Exercise   Exercise Type  ADL's      Patient Education   Nutrition management   Role of diet in the treatment of diabetes and the relationship between the three main macronutrients and blood glucose level;Food label reading, portion sizes and measuring food.;Meal options for control of blood glucose level and chronic complications.   dining out, options for protein shakes as meal replacments   Physical activity and exercise   Other (comment)   effects of physical activity on BGs   Monitoring  Taught/discussed recording of test results and interpretation of SMBG.      Post-Education Assessment   Patient understands the diabetes disease and treatment process.  Demonstrates understanding / competency    Patient understands incorporating nutritional management into lifestyle.  Demonstrates understanding / competency    Patient undertands incorporating physical activity into lifestyle.  Demonstrates understanding / competency    Patient understands using medications safely.  Demonstrates understanding / competency    Patient understands monitoring blood glucose, interpreting and using results  Demonstrates understanding / competency    Patient understands prevention, detection, and treatment of acute complications.  Needs Review    Patient understands prevention, detection, and treatment of chronic complications.  Demonstrates understanding / competency    Patient understands how to develop strategies to address psychosocial issues.  Demonstrates understanding / competency    Patient understands how to develop strategies to promote health/change behavior.  Demonstrates understanding / competency      Outcomes   Program Status  Completed       Learning Objective:  Patient will have a greater understanding of diabetes self-management. Patient education plan is to attend individual and/or group sessions per assessed needs and concerns.  Brendan Barnes has made a significant change in avoiding sweets and sugar-sweetened beverages, and he is motivated to continue. He generally eats balanced meals; encouraged inclusion of vegetables and fruits with all meals.   Plan:   Patient Instructions   Check food labels on snack foods for "Total Carbohydrate" and aim for less than 30 grams. Healthy choices will also have a few grams of fiber.   Continue to limit sweets by limiting portions and frequency of eating.     Expected Outcomes:  Demonstrated interest in learning. Expect positive outcomes  Education material provided: Plate planner with food lists; Eating Healthy when Dining Out; sample menus; websites and online resources handout  If problems or questions, patient to contact team via:  Phone and Email

## 2018-10-21 NOTE — Patient Instructions (Signed)
   Check food labels on snack foods for "Total Carbohydrate" and aim for less than 30 grams. Healthy choices will also have a few grams of fiber.   Continue to limit sweets by limiting portions and frequency of eating.

## 2018-12-25 ENCOUNTER — Telehealth: Payer: Self-pay

## 2018-12-25 NOTE — Telephone Encounter (Signed)
We received a fax for PT orders from [facility] or [facility]. Dr Silvio Pate is asking what this is for. He knows nothing about it.

## 2018-12-28 DIAGNOSIS — H1132 Conjunctival hemorrhage, left eye: Secondary | ICD-10-CM | POA: Diagnosis not present

## 2019-01-26 DIAGNOSIS — L821 Other seborrheic keratosis: Secondary | ICD-10-CM | POA: Diagnosis not present

## 2019-01-26 DIAGNOSIS — L218 Other seborrheic dermatitis: Secondary | ICD-10-CM | POA: Diagnosis not present

## 2019-01-26 DIAGNOSIS — L300 Nummular dermatitis: Secondary | ICD-10-CM | POA: Diagnosis not present

## 2019-02-19 ENCOUNTER — Other Ambulatory Visit: Payer: Self-pay | Admitting: Internal Medicine

## 2019-02-19 DIAGNOSIS — E1140 Type 2 diabetes mellitus with diabetic neuropathy, unspecified: Secondary | ICD-10-CM

## 2019-02-25 ENCOUNTER — Other Ambulatory Visit: Payer: Self-pay

## 2019-02-25 ENCOUNTER — Other Ambulatory Visit (INDEPENDENT_AMBULATORY_CARE_PROVIDER_SITE_OTHER): Payer: Medicare Other

## 2019-02-25 DIAGNOSIS — E1140 Type 2 diabetes mellitus with diabetic neuropathy, unspecified: Secondary | ICD-10-CM

## 2019-02-25 LAB — HEMOGLOBIN A1C: Hgb A1c MFr Bld: 5.7 % (ref 4.6–6.5)

## 2019-02-26 ENCOUNTER — Ambulatory Visit (INDEPENDENT_AMBULATORY_CARE_PROVIDER_SITE_OTHER): Payer: Medicare Other | Admitting: Internal Medicine

## 2019-02-26 ENCOUNTER — Encounter: Payer: Self-pay | Admitting: Internal Medicine

## 2019-02-26 VITALS — Temp 98.5°F | Wt 194.0 lb

## 2019-02-26 DIAGNOSIS — E1140 Type 2 diabetes mellitus with diabetic neuropathy, unspecified: Secondary | ICD-10-CM

## 2019-02-26 MED ORDER — SIMVASTATIN 20 MG PO TABS: 20.0000 mg | ORAL_TABLET | Freq: Every day | ORAL | 3 refills | Status: DC

## 2019-02-26 MED ORDER — FINASTERIDE 5 MG PO TABS: 5.0000 mg | ORAL_TABLET | Freq: Every day | ORAL | 3 refills | Status: DC

## 2019-02-26 MED ORDER — AMLODIPINE BESYLATE 5 MG PO TABS: 5.0000 mg | ORAL_TABLET | Freq: Every day | ORAL | 3 refills | Status: DC

## 2019-02-26 MED ORDER — LISINOPRIL-HYDROCHLOROTHIAZIDE 20-25 MG PO TABS: 1.0000 | ORAL_TABLET | Freq: Every day | ORAL | 3 refills | Status: DC

## 2019-02-26 MED ORDER — METFORMIN HCL ER 500 MG PO TB24: 1000.0000 mg | ORAL_TABLET | Freq: Every day | ORAL | 3 refills | Status: DC

## 2019-02-26 NOTE — Assessment & Plan Note (Signed)
Lab Results  Component Value Date   HGBA1C 5.7 02/25/2019   He continues to have excellent control Chronic neuropathy but no pain issues now No change needed

## 2019-02-26 NOTE — Progress Notes (Signed)
Subjective:    Patient ID: Brendan Barnes, male    DOB: 08-Mar-1941, 78 y.o.   MRN: [id number]  HPI Virtual visit for follow up of diabetes Identification done Reviewed billing and he gives his consent He is in his home and I am in my office  He is doing fairly well Has maintained his improved lifestyle since the diabetes diagnosis Checks sugars 3 times a week Usually 100's No numbness or pain in feet  No chest pain No SOB No sig edema  Still with intermittent back pain Uses the diclofenac prn--not that often Does get out daily to walk--but doesn't tolerate going too far Discussed considering lidocaine patch  Current Outpatient Medications on File Prior to Visit  Medication Sig Dispense Refill  . Azelastine HCl 137 MCG/SPRAY SOLN Place 2 sprays into the nose at bedtime.     . diclofenac (VOLTAREN) 75 MG EC tablet Take 1 tablet (75 mg total) by mouth as needed. 90 tablet 0  . glucose blood (FREESTYLE LITE) test strip Use to check blood sugar once daily. Dx Code E11.9 100 each 3  . Multiple Vitamin (MULTIVITAMIN) tablet Take 1 tablet by mouth daily.    . polyethylene glycol (MIRALAX / GLYCOLAX) packet Take 17 g by mouth 3 (three) times a week.    . potassium chloride SA (K-DUR,KLOR-CON) 20 MEQ tablet Take 1 tablet (20 mEq total) by mouth 2 (two) times daily. 180 tablet 1   No current facility-administered medications on file prior to visit.     No Known Allergies  Past Medical History:  Diagnosis Date  . BPH (benign prostatic hypertrophy)   . Diabetes mellitus without complication ([place])   . ED (erectile dysfunction)   . HLD (hyperlipidemia)   . HTN (hypertension)   . IBS (irritable bowel syndrome)   . Osteoarthritis     Past Surgical History:  Procedure Laterality Date  . COLONOSCOPY WITH PROPOFOL N/A 01/06/2017   Procedure: COLONOSCOPY WITH PROPOFOL;  Surgeon: Lollie Sails, MD;  Location[facility] ENDOSCOPY;  Service: Endoscopy;  Laterality: N/A;  . nasal  "growth"  2000  . toenail removal    . US ECHOCARDIOGRAPHY  06/2005   LV EF normal, Mild LVH, LAE    Family History  Problem Relation Age of Onset  . Prostate cancer Father        prostate  . Hyperlipidemia Father   . Arrhythmia Father   . Kidney failure Mother   . Heart failure Mother   . Gout Mother   . Scoliosis Son     Social History   Socioeconomic History  . Marital status: Married    Spouse name: Not on file  . Number of children: 2  . Years of education: Not on file  . Highest education level: Not on file  Occupational History  . 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 Financial resource strain: Not on file  . Food insecurity:    Worry: Not on file    Inability: Not on file  . Transportation needs:    Medical: Not on file    Non-medical: Not on file  Tobacco Use  . Smoking status: Never Smoker  . Smokeless tobacco: Never Used  Substance and Sexual Activity  . Alcohol use: Not Currently  . Drug use: No  . Sexual activity: Not on file  Lifestyle  . Physical activity:    Days per week: Not on file    Minutes per  session: Not on file  . Stress: Not on file  Relationships  . Social connections:    Talks on phone: Not on file    Gets together: Not on file    Attends religious service: Not on file    Active member of club or organization: Not on file    Attends meetings of clubs or organizations: Not on file    Relationship status: Not on file  . Intimate partner violence:    Fear of current or ex partner: Not on file    Emotionally abused: Not on file    Physically abused: Not on file    Forced sexual activity: Not on file  Other Topics Concern  . Not on file  Social History Narrative   Has living will   Wife [name] is his health care POA-- alternate is son Ildefonso   Would accept resuscitation attempts   Might accept tube feeds based on situation   Review of Systems Weight is stable Appetite is fine Sleeps fairly well  Bowels are fine Voids okay Continues on omega 3 fish oil for arthritic thumbs    Objective:   Physical Exam  Constitutional: No distress.  Respiratory: Effort normal. No respiratory distress.  Psychiatric: He has a normal mood and affect. His behavior is normal.           Assessment & Plan:

## 2019-05-29 ENCOUNTER — Other Ambulatory Visit: Payer: Self-pay | Admitting: Internal Medicine

## 2019-05-31 ENCOUNTER — Other Ambulatory Visit: Payer: Self-pay

## 2019-05-31 MED ORDER — POTASSIUM CHLORIDE CRYS ER 20 MEQ PO TBCR: 20.0000 meq | EXTENDED_RELEASE_TABLET | Freq: Two times a day (BID) | ORAL | 1 refills | Status: DC

## 2019-05-31 NOTE — Telephone Encounter (Signed)
Still says last fill in November 2019 from what I seen. But this is a second refill request. There is another one pending from 05/29/2019

## 2019-05-31 NOTE — Telephone Encounter (Signed)
Dr. Letvak's patient 

## 2019-05-31 NOTE — Telephone Encounter (Signed)
It looks like this may have already been sent to the pharmacy?

## 2019-08-10 ENCOUNTER — Other Ambulatory Visit: Payer: Self-pay | Admitting: Internal Medicine

## 2019-08-10 ENCOUNTER — Other Ambulatory Visit: Payer: Self-pay

## 2019-08-10 ENCOUNTER — Ambulatory Visit (INDEPENDENT_AMBULATORY_CARE_PROVIDER_SITE_OTHER): Payer: Medicare Other | Admitting: Internal Medicine

## 2019-08-10 ENCOUNTER — Encounter: Payer: Self-pay | Admitting: Internal Medicine

## 2019-08-10 VITALS — BP 122/84 | HR 65 | Ht 70.5 in | Wt 196.0 lb

## 2019-08-10 DIAGNOSIS — Z7189 Other specified counseling: Secondary | ICD-10-CM | POA: Diagnosis not present

## 2019-08-10 DIAGNOSIS — M47815 Spondylosis without myelopathy or radiculopathy, thoracolumbar region: Secondary | ICD-10-CM | POA: Diagnosis not present

## 2019-08-10 DIAGNOSIS — I10 Essential (primary) hypertension: Secondary | ICD-10-CM | POA: Diagnosis not present

## 2019-08-10 DIAGNOSIS — Z23 Encounter for immunization: Secondary | ICD-10-CM | POA: Diagnosis not present

## 2019-08-10 DIAGNOSIS — Z0000 Encounter for general adult medical examination without abnormal findings: Secondary | ICD-10-CM

## 2019-08-10 DIAGNOSIS — E1140 Type 2 diabetes mellitus with diabetic neuropathy, unspecified: Secondary | ICD-10-CM

## 2019-08-10 DIAGNOSIS — N400 Enlarged prostate without lower urinary tract symptoms: Secondary | ICD-10-CM

## 2019-08-10 LAB — CBC
HCT: 42.9 % (ref 39.0–52.0)
Hemoglobin: 14.8 g/dL (ref 13.0–17.0)
MCHC: 34.4 g/dL (ref 30.0–36.0)
MCV: 88 fl (ref 78.0–100.0)
Platelets: 194 10*3/uL (ref 150.0–400.0)
RBC: 4.87 Mil/uL (ref 4.22–5.81)
RDW: 13 % (ref 11.5–15.5)
WBC: 6.6 10*3/uL (ref 4.0–10.5)

## 2019-08-10 LAB — HEMOGLOBIN A1C: Hgb A1c MFr Bld: 5.8 % (ref 4.6–6.5)

## 2019-08-10 LAB — COMPREHENSIVE METABOLIC PANEL
ALT: 11 U/L (ref 0–53)
AST: 13 U/L (ref 0–37)
Albumin: 4.2 g/dL (ref 3.5–5.2)
Alkaline Phosphatase: 55 U/L (ref 39–117)
BUN: 21 mg/dL (ref 6–23)
CO2: 33 mEq/L — ABNORMAL HIGH (ref 19–32)
Calcium: 10 mg/dL (ref 8.4–10.5)
Chloride: 99 mEq/L (ref 96–112)
Creatinine, Ser: 0.79 mg/dL (ref 0.40–1.50)
GFR: 94.76 mL/min (ref >60.00)
Glucose, Bld: 96 mg/dL (ref 70–99)
Potassium: 3.4 mEq/L — ABNORMAL LOW (ref 3.5–5.1)
Sodium: 138 mEq/L (ref 135–145)
Total Bilirubin: 1.6 mg/dL — ABNORMAL HIGH (ref 0.2–1.2)
Total Protein: 6.7 g/dL (ref 6.0–8.3)

## 2019-08-10 LAB — LIPID PANEL
Cholesterol: 149 mg/dL (ref 0–200)
HDL: 49.8 mg/dL (ref >39.00)
LDL Cholesterol: 77 mg/dL (ref 0–99)
NonHDL: 98.99
Total CHOL/HDL Ratio: 3
Triglycerides: 110 mg/dL (ref 0.0–149.0)
VLDL: 22 mg/dL (ref 0.0–40.0)

## 2019-08-10 LAB — HM DIABETES FOOT EXAM

## 2019-08-10 NOTE — Assessment & Plan Note (Signed)
I have personally reviewed the Medicare Annual Wellness questionnaire and have noted 1. The patient's medical and social history 2. Their use of alcohol, tobacco or illicit drugs 3. Their current medications and supplements 4. The patient's functional ability including ADL's, fall risks, home safety risks and hearing or visual             impairment. 5. Diet and physical activities 6. Evidence for depression or mood disorders  The patients weight, height, BMI and visual acuity have been recorded in the chart I have made referrals, counseling and provided education to the patient based review of the above and I have provided the pt with a written personalized care plan for preventive services.  I have provided you with a copy of your personalized plan for preventive services. Please take the time to review along with your updated medication list.  Done with cancer screening Discussed increased exercise Flu vaccine today Consider shingrix at the pharmacy

## 2019-08-10 NOTE — Addendum Note (Signed)
Addended by: Pilar Grammes on: 08/10/2019 11:38 AM   Modules accepted: Orders

## 2019-08-10 NOTE — Assessment & Plan Note (Signed)
Uses the diclofenac prn

## 2019-08-10 NOTE — Progress Notes (Signed)
Subjective:    Patient ID: Brendan Barnes, male    DOB: 04/14/1941, 78 y.o.   MRN: [id number]  HPI Here for Medicare wellness visit and follow up of chronic health conditions Reviewed form and advanced directives Reviewed other doctors No alcohol or tobacco Not really exercising--tries to walk Vision is fine Hearing is fine No falls No depression or anhedonia. Back to playing bridge on line Independent with instrumental ADLs No memory problems  Chronic left sided nasal congestion---azelastine no help May go back to ENT--[place] Takes a while to clear out drainage--in AM and after eating flonase didn't help either  Checks sugars 3 times a week at least Usually under 120 No low sugar reactions Ongoing foot numbness---gets tightening feeling (but really painful)  No chest pain No SOB No dizziness or syncope No edema No palpitations  Episodic back pain Uses the diclofenac only prn Walking seems to help  Voids fine No nocturia  Current Outpatient Medications on File Prior to Visit  Medication Sig Dispense Refill  . amLODipine (NORVASC) 5 MG tablet Take 1 tablet (5 mg total) by mouth daily. 90 tablet 3  . diclofenac (VOLTAREN) 75 MG EC tablet Take 1 tablet (75 mg total) by mouth as needed. 90 tablet 0  . finasteride (PROSCAR) 5 MG tablet Take 1 tablet (5 mg total) by mouth daily. 90 tablet 3  . glucose blood (FREESTYLE LITE) test strip Use to check blood sugar once daily. Dx Code E11.9 100 each 3  . lisinopril-hydrochlorothiazide (ZESTORETIC) 20-25 MG tablet Take 1 tablet by mouth daily. 90 tablet 3  . metFORMIN (GLUCOPHAGE-XR) 500 MG 24 hr tablet Take 2 tablets (1,000 mg total) by mouth daily with breakfast. 180 tablet 3  . Multiple Vitamin (MULTIVITAMIN) tablet Take 1 tablet by mouth daily.    . polyethylene glycol (MIRALAX / GLYCOLAX) packet Take 17 g by mouth 3 (three) times a week.    . simvastatin (ZOCOR) 20 MG tablet Take 1 tablet (20 mg total) by mouth daily.  90 tablet 3   No current facility-administered medications on file prior to visit.     No Known Allergies  Past Medical History:  Diagnosis Date  . BPH (benign prostatic hypertrophy)   . Diabetes mellitus without complication ([place])   . ED (erectile dysfunction)   . HLD (hyperlipidemia)   . HTN (hypertension)   . IBS (irritable bowel syndrome)   . Osteoarthritis     Past Surgical History:  Procedure Laterality Date  . COLONOSCOPY WITH PROPOFOL N/A 01/06/2017   Procedure: COLONOSCOPY WITH PROPOFOL;  Surgeon: Lollie Sails, MD;  Location: [facility] ENDOSCOPY;  Service: Endoscopy;  Laterality: N/A;  . nasal "growth"  2000  . toenail removal    . US ECHOCARDIOGRAPHY  06/2005   LV EF normal, Mild LVH, LAE    Family History  Problem Relation Age of Onset  . Prostate cancer Father        prostate  . Hyperlipidemia Father   . Arrhythmia Father   . Kidney failure Mother   . Heart failure Mother   . Gout Mother   . Scoliosis Son     Social History   Socioeconomic History  . Marital status: Married    Spouse name: Not on file  . Number of children: 2  . Years of education: Not on file  . Highest education level: Not on file  Occupational History  . 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  Financial resource strain: Not on file  . Food insecurity    Worry: Not on file    Inability: Not on file  . Transportation needs    Medical: Not on file    Non-medical: Not on file  Tobacco Use  . Smoking status: Never Smoker  . Smokeless tobacco: Never Used  Substance and Sexual Activity  . Alcohol use: Not Currently  . Drug use: No  . Sexual activity: Not on file  Lifestyle  . Physical activity    Days per week: Not on file    Minutes per session: Not on file  . Stress: Not on file  Relationships  . Social Herbalist on phone: Not on file    Gets together: Not on file    Attends religious service: Not on file    Active member of club  or organization: Not on file    Attends meetings of clubs or organizations: Not on file    Relationship status: Not on file  . Intimate partner violence    Fear of current or ex partner: Not on file    Emotionally abused: Not on file    Physically abused: Not on file    Forced sexual activity: Not on file  Other Topics Concern  . Not on file  Social History Narrative   Has living will   Wife [name] is his health care POA-- alternate is son Marcus   Would accept resuscitation attempts   Might accept tube feeds based on situation   Review of Systems Getting new crown--otherwise teeth okay Sees derm regularly---- cryotherapy at each visit Appetite is fine Weight is down from the past-he relates this to metformin Sleeps well Wears seat belt Occasional left frontal headaches No heartburn---does have some "constriction" in the afternoon though. Mostly has trouble with the potassium pill     Objective:   Physical Exam  Constitutional: He is oriented to person, place, and time. He appears well-developed. No distress.  HENT:  Mouth/Throat: Oropharynx is clear and moist. No oropharyngeal exudate.  Neck: No thyromegaly present.  Cardiovascular: Normal rate, regular rhythm, normal heart sounds and intact distal pulses. Exam reveals no gallop.  No murmur heard. Frequent skipped beats Faint pedal pulses  Respiratory: Effort normal and breath sounds normal. No respiratory distress. He has no wheezes. He has no rales.  GI: Soft. There is no abdominal tenderness.  Musculoskeletal:        General: No tenderness or edema.  Lymphadenopathy:    He has no cervical adenopathy.  Neurological: He is alert and oriented to person, place, and time.  President--- "Daisy Floro, [place]" [phone] D-[id number] Recall 3/3  Mildly decreased sensation in feet  Skin: No rash noted. No erythema.  No foot lesions  Psychiatric: He has a normal mood and affect. His behavior is normal.            Assessment & Plan:

## 2019-08-10 NOTE — Assessment & Plan Note (Signed)
See social history 

## 2019-08-10 NOTE — Progress Notes (Signed)
Hearing Screening   Method: Audiometry   125Hz  250Hz  500Hz  1000Hz  2000Hz  3000Hz  4000Hz  6000Hz  8000Hz   Right ear:   20 20 20   0    Left ear:   20 40 40  0    Vision Screening Comments: October 2019 Appt next week

## 2019-08-10 NOTE — Assessment & Plan Note (Signed)
BP Readings from Last 3 Encounters:  08/10/19 122/84  10/21/18 (!) 160/70  09/16/18 (!) 150/80   Good control Will check labs

## 2019-08-10 NOTE — Assessment & Plan Note (Signed)
Seems to still have good control Mild neuropathy

## 2019-08-10 NOTE — Assessment & Plan Note (Signed)
Okay on the finasteride

## 2019-08-18 DIAGNOSIS — H40003 Preglaucoma, unspecified, bilateral: Secondary | ICD-10-CM | POA: Diagnosis not present

## 2019-08-18 DIAGNOSIS — E119 Type 2 diabetes mellitus without complications: Secondary | ICD-10-CM | POA: Diagnosis not present

## 2019-08-18 LAB — HM DIABETES EYE EXAM

## 2019-08-24 ENCOUNTER — Encounter: Payer: Self-pay | Admitting: Ophthalmology

## 2019-09-27 DIAGNOSIS — D485 Neoplasm of uncertain behavior of skin: Secondary | ICD-10-CM | POA: Diagnosis not present

## 2019-10-21 DIAGNOSIS — C44612 Basal cell carcinoma of skin of right upper limb, including shoulder: Secondary | ICD-10-CM | POA: Diagnosis not present

## 2019-12-09 ENCOUNTER — Encounter: Payer: Self-pay | Admitting: Internal Medicine

## 2019-12-09 ENCOUNTER — Other Ambulatory Visit: Payer: Self-pay

## 2019-12-09 ENCOUNTER — Ambulatory Visit (INDEPENDENT_AMBULATORY_CARE_PROVIDER_SITE_OTHER): Payer: Medicare Other | Admitting: Internal Medicine

## 2019-12-09 DIAGNOSIS — M48061 Spinal stenosis, lumbar region without neurogenic claudication: Secondary | ICD-10-CM

## 2019-12-09 DIAGNOSIS — M9973 Connective tissue and disc stenosis of intervertebral foramina of lumbar region: Secondary | ICD-10-CM | POA: Diagnosis not present

## 2019-12-09 MED ORDER — GABAPENTIN 100 MG PO CAPS: 100.0000 mg | ORAL_CAPSULE | Freq: Three times a day (TID) | ORAL | 3 refills | Status: DC

## 2019-12-09 NOTE — Patient Instructions (Addendum)
You can call [facility] clinic yourself about physiatry appointment (referral has been made but maybe you can get it quicker by calling)  Start the gabapentin with 1 capsule three times a day--if it seems to help, let me know and I can increase the dose.

## 2019-12-09 NOTE — Progress Notes (Signed)
Subjective:    Patient ID: Brendan Barnes, male    DOB: 26-Dec-1940, 79 y.o.   MRN: [id number]  HPI Here due to worsening back pain  Pain started in 1956--- tried to pull trailer up to tractor and he was in pain then Did improve over time In 1980, went "out" for 8 weeks---couldn't walk. Had MRI and was scheduled for surgery but didn't have it Had MRI in 2003----multiple bulging discs and some spinal stenosis  Lots of stress caring for wife--who just died She was very heavy and he needed to do a lot of her care Has pain daily---sharp at times, or constant ache---across bilateral lumbar back Past sciatica in right leg--now stops in buttock (2003)  Painful when walking---diclofenac and tylenol did help some No clear leg weakness---but pain worse if prolonged standing/sitting  Current Outpatient Medications on File Prior to Visit  Medication Sig Dispense Refill  . amLODipine (NORVASC) 5 MG tablet Take 1 tablet (5 mg total) by mouth daily. 90 tablet 3  . diclofenac (VOLTAREN) 75 MG EC tablet Take 1 tablet (75 mg total) by mouth as needed. 90 tablet 0  . finasteride (PROSCAR) 5 MG tablet Take 1 tablet (5 mg total) by mouth daily. 90 tablet 3  . glucose blood (FREESTYLE LITE) test strip Use to check blood sugar once daily. Dx Code E11.9 100 each 3  . lisinopril-hydrochlorothiazide (ZESTORETIC) 20-25 MG tablet Take 1 tablet by mouth daily. 90 tablet 3  . metFORMIN (GLUCOPHAGE-XR) 500 MG 24 hr tablet Take 2 tablets (1,000 mg total) by mouth daily with breakfast. 180 tablet 3  . Multiple Vitamin (MULTIVITAMIN) tablet Take 1 tablet by mouth daily.    . polyethylene glycol (MIRALAX / GLYCOLAX) packet Take 17 g by mouth 3 (three) times a week.    . potassium chloride SA (KLOR-CON) 20 MEQ tablet TAKE 1 TABLET TWICE A DAY 180 tablet 3  . simvastatin (ZOCOR) 20 MG tablet Take 1 tablet (20 mg total) by mouth daily. 90 tablet 3   No current facility-administered medications on file prior to visit.     No Known Allergies  Past Medical History:  Diagnosis Date  . BPH (benign prostatic hypertrophy)   . Diabetes mellitus without complication ([place])   . ED (erectile dysfunction)   . HLD (hyperlipidemia)   . HTN (hypertension)   . IBS (irritable bowel syndrome)   . Osteoarthritis     Past Surgical History:  Procedure Laterality Date  . COLONOSCOPY WITH PROPOFOL N/A 01/06/2017   Procedure: COLONOSCOPY WITH PROPOFOL;  Surgeon: Lollie Sails, MD;  Location: [facility] ENDOSCOPY;  Service: Endoscopy;  Laterality: N/A;  . nasal "growth"  2000  . toenail removal    . US ECHOCARDIOGRAPHY  06/2005   LV EF normal, Mild LVH, LAE    Family History  Problem Relation Age of Onset  . Prostate cancer Father        prostate  . Hyperlipidemia Father   . Arrhythmia Father   . Kidney failure Mother   . Heart failure Mother   . Gout Mother   . Scoliosis Son     Social History   Socioeconomic History  . Marital status: Widowed    Spouse name: Not on file  . Number of children: 2  . Years of education: Not on file  . Highest education level: Not on file  Occupational History  . 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 Smoking status: Never  Smoker  . Smokeless tobacco: Never Used  Substance and Sexual Activity  . Alcohol use: Not Currently  . Drug use: No  . Sexual activity: Not on file  Other Topics Concern  . Not on file  Social History Narrative   Widowed 2021      Has living will   Son Kamaron is his health care POA   Would accept resuscitation attempts   Might accept tube feeds based on situation   Social Determinants of Health   Financial Resource Strain:   . Difficulty of Paying Living Expenses: Not on file  Food Insecurity:   . Worried About Charity fundraiser in the Last Year: Not on file  . Ran Out of Food in the Last Year: Not on file  Transportation Needs:   . Lack of Transportation (Medical): Not on file  . Lack of Transportation  (Non-Medical): Not on file  Physical Activity:   . Days of Exercise per Week: Not on file  . Minutes of Exercise per Session: Not on file  Stress:   . Feeling of Stress : Not on file  Social Connections:   . Frequency of Communication with Friends and Family: Not on file  . Frequency of Social Gatherings with Friends and Family: Not on file  . Attends Religious Services: Not on file  . Active Member of Clubs or Organizations: Not on file  . Attends Archivist Meetings: Not on file  . Marital Status: Not on file  Intimate Partner Violence:   . Fear of Current or Ex-Partner: Not on file  . Emotionally Abused: Not on file  . Physically Abused: Not on file  . Sexually Abused: Not on file   Review of Systems  No loss of bowel or bladder function Has back brace he used if lifting      Objective:   Physical Exam  Constitutional: No distress.  Musculoskeletal:     Comments: Mild lumbar spine tenderness SLR negative Fairly normal ROM in hips  Neurological:  Normal gait but painful Strength in legs is symmetric---pain with hip flexion but strength okay           Assessment & Plan:

## 2019-12-09 NOTE — Assessment & Plan Note (Addendum)
Back pain goes back for decades 2003 MRI shows early spinal stenosis and current symptoms suggest worsening Discussed options---surgery should be last resort Will set up with physiatry Will try low dose gabapentin as well

## 2019-12-13 DIAGNOSIS — M545 Low back pain: Secondary | ICD-10-CM | POA: Diagnosis not present

## 2019-12-13 DIAGNOSIS — M5136 Other intervertebral disc degeneration, lumbar region: Secondary | ICD-10-CM | POA: Diagnosis not present

## 2019-12-13 DIAGNOSIS — M6281 Muscle weakness (generalized): Secondary | ICD-10-CM | POA: Diagnosis not present

## 2019-12-13 DIAGNOSIS — R2689 Other abnormalities of gait and mobility: Secondary | ICD-10-CM | POA: Diagnosis not present

## 2019-12-17 DIAGNOSIS — R2689 Other abnormalities of gait and mobility: Secondary | ICD-10-CM | POA: Diagnosis not present

## 2019-12-17 DIAGNOSIS — M5136 Other intervertebral disc degeneration, lumbar region: Secondary | ICD-10-CM | POA: Diagnosis not present

## 2019-12-17 DIAGNOSIS — M6281 Muscle weakness (generalized): Secondary | ICD-10-CM | POA: Diagnosis not present

## 2019-12-17 DIAGNOSIS — M545 Low back pain: Secondary | ICD-10-CM | POA: Diagnosis not present

## 2019-12-22 DIAGNOSIS — M6281 Muscle weakness (generalized): Secondary | ICD-10-CM | POA: Diagnosis not present

## 2019-12-22 DIAGNOSIS — M545 Low back pain: Secondary | ICD-10-CM | POA: Diagnosis not present

## 2019-12-22 DIAGNOSIS — M5136 Other intervertebral disc degeneration, lumbar region: Secondary | ICD-10-CM | POA: Diagnosis not present

## 2019-12-22 DIAGNOSIS — R2689 Other abnormalities of gait and mobility: Secondary | ICD-10-CM | POA: Diagnosis not present

## 2019-12-24 DIAGNOSIS — M545 Low back pain: Secondary | ICD-10-CM | POA: Diagnosis not present

## 2019-12-24 DIAGNOSIS — M5136 Other intervertebral disc degeneration, lumbar region: Secondary | ICD-10-CM | POA: Diagnosis not present

## 2019-12-24 DIAGNOSIS — M6281 Muscle weakness (generalized): Secondary | ICD-10-CM | POA: Diagnosis not present

## 2019-12-24 DIAGNOSIS — R2689 Other abnormalities of gait and mobility: Secondary | ICD-10-CM | POA: Diagnosis not present

## 2019-12-28 DIAGNOSIS — M6281 Muscle weakness (generalized): Secondary | ICD-10-CM | POA: Diagnosis not present

## 2019-12-28 DIAGNOSIS — M5136 Other intervertebral disc degeneration, lumbar region: Secondary | ICD-10-CM | POA: Diagnosis not present

## 2019-12-28 DIAGNOSIS — M545 Low back pain: Secondary | ICD-10-CM | POA: Diagnosis not present

## 2019-12-28 DIAGNOSIS — R2689 Other abnormalities of gait and mobility: Secondary | ICD-10-CM | POA: Diagnosis not present

## 2019-12-29 DIAGNOSIS — M5136 Other intervertebral disc degeneration, lumbar region: Secondary | ICD-10-CM | POA: Diagnosis not present

## 2019-12-29 DIAGNOSIS — M6281 Muscle weakness (generalized): Secondary | ICD-10-CM | POA: Diagnosis not present

## 2019-12-29 DIAGNOSIS — R2689 Other abnormalities of gait and mobility: Secondary | ICD-10-CM | POA: Diagnosis not present

## 2019-12-29 DIAGNOSIS — M545 Low back pain: Secondary | ICD-10-CM | POA: Diagnosis not present

## 2019-12-31 DIAGNOSIS — M5136 Other intervertebral disc degeneration, lumbar region: Secondary | ICD-10-CM | POA: Diagnosis not present

## 2019-12-31 DIAGNOSIS — M6281 Muscle weakness (generalized): Secondary | ICD-10-CM | POA: Diagnosis not present

## 2019-12-31 DIAGNOSIS — M545 Low back pain: Secondary | ICD-10-CM | POA: Diagnosis not present

## 2019-12-31 DIAGNOSIS — R2689 Other abnormalities of gait and mobility: Secondary | ICD-10-CM | POA: Diagnosis not present

## 2020-01-03 DIAGNOSIS — M6281 Muscle weakness (generalized): Secondary | ICD-10-CM | POA: Diagnosis not present

## 2020-01-03 DIAGNOSIS — M5136 Other intervertebral disc degeneration, lumbar region: Secondary | ICD-10-CM | POA: Diagnosis not present

## 2020-01-03 DIAGNOSIS — M545 Low back pain: Secondary | ICD-10-CM | POA: Diagnosis not present

## 2020-01-03 DIAGNOSIS — R2689 Other abnormalities of gait and mobility: Secondary | ICD-10-CM | POA: Diagnosis not present

## 2020-01-05 DIAGNOSIS — M6281 Muscle weakness (generalized): Secondary | ICD-10-CM | POA: Diagnosis not present

## 2020-01-05 DIAGNOSIS — R2689 Other abnormalities of gait and mobility: Secondary | ICD-10-CM | POA: Diagnosis not present

## 2020-01-05 DIAGNOSIS — M5136 Other intervertebral disc degeneration, lumbar region: Secondary | ICD-10-CM | POA: Diagnosis not present

## 2020-01-05 DIAGNOSIS — M545 Low back pain: Secondary | ICD-10-CM | POA: Diagnosis not present

## 2020-01-07 DIAGNOSIS — M5136 Other intervertebral disc degeneration, lumbar region: Secondary | ICD-10-CM | POA: Diagnosis not present

## 2020-01-07 DIAGNOSIS — M545 Low back pain: Secondary | ICD-10-CM | POA: Diagnosis not present

## 2020-01-07 DIAGNOSIS — R2689 Other abnormalities of gait and mobility: Secondary | ICD-10-CM | POA: Diagnosis not present

## 2020-01-07 DIAGNOSIS — M6281 Muscle weakness (generalized): Secondary | ICD-10-CM | POA: Diagnosis not present

## 2020-01-10 DIAGNOSIS — R2689 Other abnormalities of gait and mobility: Secondary | ICD-10-CM | POA: Diagnosis not present

## 2020-01-10 DIAGNOSIS — M5136 Other intervertebral disc degeneration, lumbar region: Secondary | ICD-10-CM | POA: Diagnosis not present

## 2020-01-10 DIAGNOSIS — M545 Low back pain: Secondary | ICD-10-CM | POA: Diagnosis not present

## 2020-01-10 DIAGNOSIS — M6281 Muscle weakness (generalized): Secondary | ICD-10-CM | POA: Diagnosis not present

## 2020-01-11 DIAGNOSIS — M79672 Pain in left foot: Secondary | ICD-10-CM | POA: Diagnosis not present

## 2020-01-11 DIAGNOSIS — M2042 Other hammer toe(s) (acquired), left foot: Secondary | ICD-10-CM | POA: Diagnosis not present

## 2020-01-11 DIAGNOSIS — M7752 Other enthesopathy of left foot: Secondary | ICD-10-CM | POA: Diagnosis not present

## 2020-01-11 DIAGNOSIS — E1142 Type 2 diabetes mellitus with diabetic polyneuropathy: Secondary | ICD-10-CM | POA: Diagnosis not present

## 2020-01-11 DIAGNOSIS — M79671 Pain in right foot: Secondary | ICD-10-CM | POA: Diagnosis not present

## 2020-01-11 DIAGNOSIS — M7751 Other enthesopathy of right foot: Secondary | ICD-10-CM | POA: Diagnosis not present

## 2020-01-12 DIAGNOSIS — R2689 Other abnormalities of gait and mobility: Secondary | ICD-10-CM | POA: Diagnosis not present

## 2020-01-12 DIAGNOSIS — M5136 Other intervertebral disc degeneration, lumbar region: Secondary | ICD-10-CM | POA: Diagnosis not present

## 2020-01-12 DIAGNOSIS — M6281 Muscle weakness (generalized): Secondary | ICD-10-CM | POA: Diagnosis not present

## 2020-01-12 DIAGNOSIS — M545 Low back pain: Secondary | ICD-10-CM | POA: Diagnosis not present

## 2020-01-13 ENCOUNTER — Other Ambulatory Visit: Payer: Self-pay | Admitting: Physical Medicine and Rehabilitation

## 2020-01-13 DIAGNOSIS — M5416 Radiculopathy, lumbar region: Secondary | ICD-10-CM

## 2020-01-13 DIAGNOSIS — M5442 Lumbago with sciatica, left side: Secondary | ICD-10-CM | POA: Insufficient documentation

## 2020-01-13 DIAGNOSIS — G8929 Other chronic pain: Secondary | ICD-10-CM | POA: Insufficient documentation

## 2020-01-19 DIAGNOSIS — R2689 Other abnormalities of gait and mobility: Secondary | ICD-10-CM | POA: Diagnosis not present

## 2020-01-19 DIAGNOSIS — M6281 Muscle weakness (generalized): Secondary | ICD-10-CM | POA: Diagnosis not present

## 2020-01-19 DIAGNOSIS — M545 Low back pain: Secondary | ICD-10-CM | POA: Diagnosis not present

## 2020-01-19 DIAGNOSIS — M5136 Other intervertebral disc degeneration, lumbar region: Secondary | ICD-10-CM | POA: Diagnosis not present

## 2020-01-21 DIAGNOSIS — M5136 Other intervertebral disc degeneration, lumbar region: Secondary | ICD-10-CM | POA: Diagnosis not present

## 2020-01-21 DIAGNOSIS — M6281 Muscle weakness (generalized): Secondary | ICD-10-CM | POA: Diagnosis not present

## 2020-01-21 DIAGNOSIS — R2689 Other abnormalities of gait and mobility: Secondary | ICD-10-CM | POA: Diagnosis not present

## 2020-01-21 DIAGNOSIS — M545 Low back pain: Secondary | ICD-10-CM | POA: Diagnosis not present

## 2020-01-24 DIAGNOSIS — M5136 Other intervertebral disc degeneration, lumbar region: Secondary | ICD-10-CM | POA: Diagnosis not present

## 2020-01-24 DIAGNOSIS — R2689 Other abnormalities of gait and mobility: Secondary | ICD-10-CM | POA: Diagnosis not present

## 2020-01-24 DIAGNOSIS — M6281 Muscle weakness (generalized): Secondary | ICD-10-CM | POA: Diagnosis not present

## 2020-01-24 DIAGNOSIS — M545 Low back pain: Secondary | ICD-10-CM | POA: Diagnosis not present

## 2020-01-25 ENCOUNTER — Other Ambulatory Visit: Payer: Self-pay

## 2020-01-25 ENCOUNTER — Ambulatory Visit
Admission: RE | Admit: 2020-01-25 | Discharge: 2020-01-25 | Disposition: A | Discharge status: Home / Self Care | Payer: Medicare Other | Source: Ambulatory Visit | Attending: Physical Medicine and Rehabilitation | Admitting: Physical Medicine and Rehabilitation

## 2020-01-25 DIAGNOSIS — M5416 Radiculopathy, lumbar region: Secondary | ICD-10-CM | POA: Diagnosis not present

## 2020-01-25 DIAGNOSIS — M545 Low back pain: Secondary | ICD-10-CM | POA: Diagnosis not present

## 2020-01-26 DIAGNOSIS — R2689 Other abnormalities of gait and mobility: Secondary | ICD-10-CM | POA: Diagnosis not present

## 2020-01-26 DIAGNOSIS — M6281 Muscle weakness (generalized): Secondary | ICD-10-CM | POA: Diagnosis not present

## 2020-01-26 DIAGNOSIS — M5136 Other intervertebral disc degeneration, lumbar region: Secondary | ICD-10-CM | POA: Diagnosis not present

## 2020-01-26 DIAGNOSIS — M545 Low back pain: Secondary | ICD-10-CM | POA: Diagnosis not present

## 2020-01-31 DIAGNOSIS — M6281 Muscle weakness (generalized): Secondary | ICD-10-CM | POA: Diagnosis not present

## 2020-01-31 DIAGNOSIS — M545 Low back pain: Secondary | ICD-10-CM | POA: Diagnosis not present

## 2020-01-31 DIAGNOSIS — R2689 Other abnormalities of gait and mobility: Secondary | ICD-10-CM | POA: Diagnosis not present

## 2020-01-31 DIAGNOSIS — M5136 Other intervertebral disc degeneration, lumbar region: Secondary | ICD-10-CM | POA: Diagnosis not present

## 2020-02-02 DIAGNOSIS — M6281 Muscle weakness (generalized): Secondary | ICD-10-CM | POA: Diagnosis not present

## 2020-02-02 DIAGNOSIS — M545 Low back pain: Secondary | ICD-10-CM | POA: Diagnosis not present

## 2020-02-02 DIAGNOSIS — R2689 Other abnormalities of gait and mobility: Secondary | ICD-10-CM | POA: Diagnosis not present

## 2020-02-02 DIAGNOSIS — M5136 Other intervertebral disc degeneration, lumbar region: Secondary | ICD-10-CM | POA: Diagnosis not present

## 2020-02-04 DIAGNOSIS — R2689 Other abnormalities of gait and mobility: Secondary | ICD-10-CM | POA: Diagnosis not present

## 2020-02-04 DIAGNOSIS — M545 Low back pain: Secondary | ICD-10-CM | POA: Diagnosis not present

## 2020-02-04 DIAGNOSIS — M5136 Other intervertebral disc degeneration, lumbar region: Secondary | ICD-10-CM | POA: Diagnosis not present

## 2020-02-04 DIAGNOSIS — M6281 Muscle weakness (generalized): Secondary | ICD-10-CM | POA: Diagnosis not present

## 2020-02-07 DIAGNOSIS — M6281 Muscle weakness (generalized): Secondary | ICD-10-CM | POA: Diagnosis not present

## 2020-02-07 DIAGNOSIS — R2689 Other abnormalities of gait and mobility: Secondary | ICD-10-CM | POA: Diagnosis not present

## 2020-02-07 DIAGNOSIS — M545 Low back pain: Secondary | ICD-10-CM | POA: Diagnosis not present

## 2020-02-07 DIAGNOSIS — M5136 Other intervertebral disc degeneration, lumbar region: Secondary | ICD-10-CM | POA: Diagnosis not present

## 2020-02-09 DIAGNOSIS — R2689 Other abnormalities of gait and mobility: Secondary | ICD-10-CM | POA: Diagnosis not present

## 2020-02-09 DIAGNOSIS — M6281 Muscle weakness (generalized): Secondary | ICD-10-CM | POA: Diagnosis not present

## 2020-02-09 DIAGNOSIS — M545 Low back pain: Secondary | ICD-10-CM | POA: Diagnosis not present

## 2020-02-09 DIAGNOSIS — M5136 Other intervertebral disc degeneration, lumbar region: Secondary | ICD-10-CM | POA: Diagnosis not present

## 2020-02-09 NOTE — Telephone Encounter (Signed)
Spoke with patient - aware of recommendations.   Nothing further needed.

## 2020-02-11 DIAGNOSIS — M6281 Muscle weakness (generalized): Secondary | ICD-10-CM | POA: Diagnosis not present

## 2020-02-11 DIAGNOSIS — R2689 Other abnormalities of gait and mobility: Secondary | ICD-10-CM | POA: Diagnosis not present

## 2020-02-11 DIAGNOSIS — M545 Low back pain: Secondary | ICD-10-CM | POA: Diagnosis not present

## 2020-02-11 DIAGNOSIS — M5136 Other intervertebral disc degeneration, lumbar region: Secondary | ICD-10-CM | POA: Diagnosis not present

## 2020-02-14 DIAGNOSIS — M545 Low back pain: Secondary | ICD-10-CM | POA: Diagnosis not present

## 2020-02-14 DIAGNOSIS — M6281 Muscle weakness (generalized): Secondary | ICD-10-CM | POA: Diagnosis not present

## 2020-02-14 DIAGNOSIS — J3489 Other specified disorders of nose and nasal sinuses: Secondary | ICD-10-CM | POA: Diagnosis not present

## 2020-02-14 DIAGNOSIS — R2689 Other abnormalities of gait and mobility: Secondary | ICD-10-CM | POA: Diagnosis not present

## 2020-02-14 DIAGNOSIS — J301 Allergic rhinitis due to pollen: Secondary | ICD-10-CM | POA: Diagnosis not present

## 2020-02-14 DIAGNOSIS — J309 Allergic rhinitis, unspecified: Secondary | ICD-10-CM | POA: Diagnosis not present

## 2020-02-14 DIAGNOSIS — M5136 Other intervertebral disc degeneration, lumbar region: Secondary | ICD-10-CM | POA: Diagnosis not present

## 2020-02-14 DIAGNOSIS — R490 Dysphonia: Secondary | ICD-10-CM | POA: Diagnosis not present

## 2020-02-14 DIAGNOSIS — J300 Vasomotor rhinitis: Secondary | ICD-10-CM | POA: Diagnosis not present

## 2020-02-16 DIAGNOSIS — M5136 Other intervertebral disc degeneration, lumbar region: Secondary | ICD-10-CM | POA: Diagnosis not present

## 2020-02-16 DIAGNOSIS — M545 Low back pain: Secondary | ICD-10-CM | POA: Diagnosis not present

## 2020-02-16 DIAGNOSIS — R2689 Other abnormalities of gait and mobility: Secondary | ICD-10-CM | POA: Diagnosis not present

## 2020-02-16 DIAGNOSIS — M6281 Muscle weakness (generalized): Secondary | ICD-10-CM | POA: Diagnosis not present

## 2020-02-18 DIAGNOSIS — Z872 Personal history of diseases of the skin and subcutaneous tissue: Secondary | ICD-10-CM | POA: Diagnosis not present

## 2020-02-18 DIAGNOSIS — X32XXXA Exposure to sunlight, initial encounter: Secondary | ICD-10-CM | POA: Diagnosis not present

## 2020-02-18 DIAGNOSIS — D0439 Carcinoma in situ of skin of other parts of face: Secondary | ICD-10-CM | POA: Diagnosis not present

## 2020-02-18 DIAGNOSIS — D2271 Melanocytic nevi of right lower limb, including hip: Secondary | ICD-10-CM | POA: Diagnosis not present

## 2020-02-18 DIAGNOSIS — Z85828 Personal history of other malignant neoplasm of skin: Secondary | ICD-10-CM | POA: Diagnosis not present

## 2020-02-18 DIAGNOSIS — L570 Actinic keratosis: Secondary | ICD-10-CM | POA: Diagnosis not present

## 2020-02-18 DIAGNOSIS — D485 Neoplasm of uncertain behavior of skin: Secondary | ICD-10-CM | POA: Diagnosis not present

## 2020-02-18 DIAGNOSIS — M5136 Other intervertebral disc degeneration, lumbar region: Secondary | ICD-10-CM | POA: Diagnosis not present

## 2020-02-18 DIAGNOSIS — D2261 Melanocytic nevi of right upper limb, including shoulder: Secondary | ICD-10-CM | POA: Diagnosis not present

## 2020-02-18 DIAGNOSIS — R2689 Other abnormalities of gait and mobility: Secondary | ICD-10-CM | POA: Diagnosis not present

## 2020-02-18 DIAGNOSIS — M545 Low back pain: Secondary | ICD-10-CM | POA: Diagnosis not present

## 2020-02-18 DIAGNOSIS — D225 Melanocytic nevi of trunk: Secondary | ICD-10-CM | POA: Diagnosis not present

## 2020-02-18 DIAGNOSIS — D2262 Melanocytic nevi of left upper limb, including shoulder: Secondary | ICD-10-CM | POA: Diagnosis not present

## 2020-02-18 DIAGNOSIS — M6281 Muscle weakness (generalized): Secondary | ICD-10-CM | POA: Diagnosis not present

## 2020-02-18 DIAGNOSIS — C44319 Basal cell carcinoma of skin of other parts of face: Secondary | ICD-10-CM | POA: Diagnosis not present

## 2020-02-21 DIAGNOSIS — M5136 Other intervertebral disc degeneration, lumbar region: Secondary | ICD-10-CM | POA: Diagnosis not present

## 2020-02-21 DIAGNOSIS — R2689 Other abnormalities of gait and mobility: Secondary | ICD-10-CM | POA: Diagnosis not present

## 2020-02-21 DIAGNOSIS — M545 Low back pain: Secondary | ICD-10-CM | POA: Diagnosis not present

## 2020-02-21 DIAGNOSIS — M6281 Muscle weakness (generalized): Secondary | ICD-10-CM | POA: Diagnosis not present

## 2020-02-23 DIAGNOSIS — M545 Low back pain: Secondary | ICD-10-CM | POA: Diagnosis not present

## 2020-02-23 DIAGNOSIS — M6281 Muscle weakness (generalized): Secondary | ICD-10-CM | POA: Diagnosis not present

## 2020-02-23 DIAGNOSIS — M5136 Other intervertebral disc degeneration, lumbar region: Secondary | ICD-10-CM | POA: Diagnosis not present

## 2020-02-23 DIAGNOSIS — R2689 Other abnormalities of gait and mobility: Secondary | ICD-10-CM | POA: Diagnosis not present

## 2020-02-25 DIAGNOSIS — M6281 Muscle weakness (generalized): Secondary | ICD-10-CM | POA: Diagnosis not present

## 2020-02-25 DIAGNOSIS — M5136 Other intervertebral disc degeneration, lumbar region: Secondary | ICD-10-CM | POA: Diagnosis not present

## 2020-02-25 DIAGNOSIS — R2689 Other abnormalities of gait and mobility: Secondary | ICD-10-CM | POA: Diagnosis not present

## 2020-02-25 DIAGNOSIS — M545 Low back pain: Secondary | ICD-10-CM | POA: Diagnosis not present

## 2020-02-28 DIAGNOSIS — M5136 Other intervertebral disc degeneration, lumbar region: Secondary | ICD-10-CM | POA: Diagnosis not present

## 2020-02-28 DIAGNOSIS — R2689 Other abnormalities of gait and mobility: Secondary | ICD-10-CM | POA: Diagnosis not present

## 2020-02-28 DIAGNOSIS — M545 Low back pain: Secondary | ICD-10-CM | POA: Diagnosis not present

## 2020-02-28 DIAGNOSIS — M6281 Muscle weakness (generalized): Secondary | ICD-10-CM | POA: Diagnosis not present

## 2020-03-01 DIAGNOSIS — M6281 Muscle weakness (generalized): Secondary | ICD-10-CM | POA: Diagnosis not present

## 2020-03-01 DIAGNOSIS — M545 Low back pain: Secondary | ICD-10-CM | POA: Diagnosis not present

## 2020-03-01 DIAGNOSIS — R2689 Other abnormalities of gait and mobility: Secondary | ICD-10-CM | POA: Diagnosis not present

## 2020-03-01 DIAGNOSIS — M5136 Other intervertebral disc degeneration, lumbar region: Secondary | ICD-10-CM | POA: Diagnosis not present

## 2020-03-03 DIAGNOSIS — M5136 Other intervertebral disc degeneration, lumbar region: Secondary | ICD-10-CM | POA: Diagnosis not present

## 2020-03-03 DIAGNOSIS — M545 Low back pain: Secondary | ICD-10-CM | POA: Diagnosis not present

## 2020-03-03 DIAGNOSIS — M6281 Muscle weakness (generalized): Secondary | ICD-10-CM | POA: Diagnosis not present

## 2020-03-03 DIAGNOSIS — R2689 Other abnormalities of gait and mobility: Secondary | ICD-10-CM | POA: Diagnosis not present

## 2020-03-06 DIAGNOSIS — M5136 Other intervertebral disc degeneration, lumbar region: Secondary | ICD-10-CM | POA: Diagnosis not present

## 2020-03-06 DIAGNOSIS — M545 Low back pain: Secondary | ICD-10-CM | POA: Diagnosis not present

## 2020-03-06 DIAGNOSIS — R2689 Other abnormalities of gait and mobility: Secondary | ICD-10-CM | POA: Diagnosis not present

## 2020-03-06 DIAGNOSIS — M6281 Muscle weakness (generalized): Secondary | ICD-10-CM | POA: Diagnosis not present

## 2020-03-08 DIAGNOSIS — M6281 Muscle weakness (generalized): Secondary | ICD-10-CM | POA: Diagnosis not present

## 2020-03-08 DIAGNOSIS — M5136 Other intervertebral disc degeneration, lumbar region: Secondary | ICD-10-CM | POA: Diagnosis not present

## 2020-03-08 DIAGNOSIS — R2689 Other abnormalities of gait and mobility: Secondary | ICD-10-CM | POA: Diagnosis not present

## 2020-03-08 DIAGNOSIS — M545 Low back pain: Secondary | ICD-10-CM | POA: Diagnosis not present

## 2020-03-09 DIAGNOSIS — C4441 Basal cell carcinoma of skin of scalp and neck: Secondary | ICD-10-CM | POA: Diagnosis not present

## 2020-03-10 DIAGNOSIS — M545 Low back pain: Secondary | ICD-10-CM | POA: Diagnosis not present

## 2020-03-10 DIAGNOSIS — R2689 Other abnormalities of gait and mobility: Secondary | ICD-10-CM | POA: Diagnosis not present

## 2020-03-10 DIAGNOSIS — M5136 Other intervertebral disc degeneration, lumbar region: Secondary | ICD-10-CM | POA: Diagnosis not present

## 2020-03-10 DIAGNOSIS — M6281 Muscle weakness (generalized): Secondary | ICD-10-CM | POA: Diagnosis not present

## 2020-03-13 DIAGNOSIS — H903 Sensorineural hearing loss, bilateral: Secondary | ICD-10-CM | POA: Diagnosis not present

## 2020-03-13 DIAGNOSIS — J309 Allergic rhinitis, unspecified: Secondary | ICD-10-CM | POA: Diagnosis not present

## 2020-03-13 DIAGNOSIS — J300 Vasomotor rhinitis: Secondary | ICD-10-CM | POA: Diagnosis not present

## 2020-03-13 DIAGNOSIS — J3489 Other specified disorders of nose and nasal sinuses: Secondary | ICD-10-CM | POA: Diagnosis not present

## 2020-03-23 DIAGNOSIS — M1711 Unilateral primary osteoarthritis, right knee: Secondary | ICD-10-CM | POA: Diagnosis not present

## 2020-03-23 DIAGNOSIS — M25561 Pain in right knee: Secondary | ICD-10-CM | POA: Diagnosis not present

## 2020-03-28 ENCOUNTER — Other Ambulatory Visit: Payer: Self-pay | Admitting: Internal Medicine

## 2020-03-29 DIAGNOSIS — L089 Local infection of the skin and subcutaneous tissue, unspecified: Secondary | ICD-10-CM | POA: Diagnosis not present

## 2020-04-07 ENCOUNTER — Ambulatory Visit: Payer: Medicare Other | Admitting: Internal Medicine

## 2020-04-11 DIAGNOSIS — L089 Local infection of the skin and subcutaneous tissue, unspecified: Secondary | ICD-10-CM | POA: Diagnosis not present

## 2020-04-17 DIAGNOSIS — L728 Other follicular cysts of the skin and subcutaneous tissue: Secondary | ICD-10-CM | POA: Diagnosis not present

## 2020-05-21 ENCOUNTER — Other Ambulatory Visit: Payer: Self-pay | Admitting: Internal Medicine

## 2020-05-22 ENCOUNTER — Other Ambulatory Visit: Payer: Self-pay | Admitting: Internal Medicine

## 2020-05-25 ENCOUNTER — Other Ambulatory Visit: Payer: Self-pay | Admitting: Internal Medicine

## 2020-07-14 DIAGNOSIS — Z20828 Contact with and (suspected) exposure to other viral communicable diseases: Secondary | ICD-10-CM | POA: Diagnosis not present

## 2020-07-24 ENCOUNTER — Other Ambulatory Visit: Payer: Self-pay

## 2020-07-24 MED ORDER — FREESTYLE LITE TEST VI STRP: ORAL_STRIP | 2 refills | Status: DC

## 2020-07-24 NOTE — Telephone Encounter (Signed)
Pt left v/m requesting glucose test strips refill. I spoke with pt and he requested the freestyle lite glucose test strips [facility]. Pt had annual on 08/10/19; refilled # 100 x 2. Pt voiced understanding and appreciative; pt will cb to schedule appt.

## 2020-08-09 DIAGNOSIS — Z23 Encounter for immunization: Secondary | ICD-10-CM | POA: Diagnosis not present

## 2020-09-04 ENCOUNTER — Other Ambulatory Visit: Payer: Self-pay | Admitting: Internal Medicine

## 2020-09-14 DIAGNOSIS — E119 Type 2 diabetes mellitus without complications: Secondary | ICD-10-CM | POA: Diagnosis not present

## 2020-09-20 DIAGNOSIS — Z09 Encounter for follow-up examination after completed treatment for conditions other than malignant neoplasm: Secondary | ICD-10-CM | POA: Diagnosis not present

## 2020-09-20 DIAGNOSIS — D229 Melanocytic nevi, unspecified: Secondary | ICD-10-CM | POA: Diagnosis not present

## 2020-09-20 DIAGNOSIS — L538 Other specified erythematous conditions: Secondary | ICD-10-CM | POA: Diagnosis not present

## 2020-09-20 DIAGNOSIS — Z08 Encounter for follow-up examination after completed treatment for malignant neoplasm: Secondary | ICD-10-CM | POA: Diagnosis not present

## 2020-09-20 DIAGNOSIS — X32XXXA Exposure to sunlight, initial encounter: Secondary | ICD-10-CM | POA: Diagnosis not present

## 2020-09-20 DIAGNOSIS — Z85828 Personal history of other malignant neoplasm of skin: Secondary | ICD-10-CM | POA: Diagnosis not present

## 2020-09-20 DIAGNOSIS — L728 Other follicular cysts of the skin and subcutaneous tissue: Secondary | ICD-10-CM | POA: Diagnosis not present

## 2020-09-20 DIAGNOSIS — L570 Actinic keratosis: Secondary | ICD-10-CM | POA: Diagnosis not present

## 2020-11-06 DIAGNOSIS — Z20828 Contact with and (suspected) exposure to other viral communicable diseases: Secondary | ICD-10-CM | POA: Diagnosis not present

## 2020-11-08 DIAGNOSIS — Z20828 Contact with and (suspected) exposure to other viral communicable diseases: Secondary | ICD-10-CM | POA: Diagnosis not present

## 2020-12-04 ENCOUNTER — Other Ambulatory Visit: Payer: Self-pay | Admitting: Internal Medicine

## 2021-03-26 DIAGNOSIS — E1159 Type 2 diabetes mellitus with other circulatory complications: Secondary | ICD-10-CM | POA: Diagnosis not present

## 2021-03-26 DIAGNOSIS — M2042 Other hammer toe(s) (acquired), left foot: Secondary | ICD-10-CM | POA: Diagnosis not present

## 2021-07-31 DIAGNOSIS — E1159 Type 2 diabetes mellitus with other circulatory complications: Secondary | ICD-10-CM | POA: Diagnosis not present

## 2021-07-31 DIAGNOSIS — B351 Tinea unguium: Secondary | ICD-10-CM | POA: Diagnosis not present

## 2021-08-08 DIAGNOSIS — H524 Presbyopia: Secondary | ICD-10-CM | POA: Diagnosis not present

## 2021-08-08 DIAGNOSIS — H532 Diplopia: Secondary | ICD-10-CM | POA: Diagnosis not present

## 2021-08-08 DIAGNOSIS — H40011 Open angle with borderline findings, low risk, right eye: Secondary | ICD-10-CM | POA: Diagnosis not present

## 2021-08-08 DIAGNOSIS — E119 Type 2 diabetes mellitus without complications: Secondary | ICD-10-CM | POA: Diagnosis not present

## 2021-08-08 LAB — HM DIABETES EYE EXAM

## 2021-08-15 DIAGNOSIS — Z23 Encounter for immunization: Secondary | ICD-10-CM | POA: Diagnosis not present

## 2021-08-23 DIAGNOSIS — D492 Neoplasm of unspecified behavior of bone, soft tissue, and skin: Secondary | ICD-10-CM | POA: Diagnosis not present

## 2021-08-23 DIAGNOSIS — L821 Other seborrheic keratosis: Secondary | ICD-10-CM | POA: Diagnosis not present

## 2021-08-23 DIAGNOSIS — L853 Xerosis cutis: Secondary | ICD-10-CM | POA: Diagnosis not present

## 2021-08-23 DIAGNOSIS — L570 Actinic keratosis: Secondary | ICD-10-CM | POA: Diagnosis not present

## 2021-09-08 ENCOUNTER — Emergency Department
Admission: EM | Admit: 2021-09-08 | Discharge: 2021-09-08 | Disposition: A | Discharge status: Home / Self Care | Payer: Medicare Other | Attending: Emergency Medicine | Admitting: Emergency Medicine

## 2021-09-08 ENCOUNTER — Encounter: Payer: Self-pay | Admitting: Emergency Medicine

## 2021-09-08 ENCOUNTER — Emergency Department: Payer: Medicare Other

## 2021-09-08 DIAGNOSIS — M7989 Other specified soft tissue disorders: Secondary | ICD-10-CM | POA: Diagnosis not present

## 2021-09-08 DIAGNOSIS — M5432 Sciatica, left side: Secondary | ICD-10-CM | POA: Diagnosis not present

## 2021-09-08 DIAGNOSIS — Z79899 Other long term (current) drug therapy: Secondary | ICD-10-CM | POA: Insufficient documentation

## 2021-09-08 DIAGNOSIS — Z7984 Long term (current) use of oral hypoglycemic drugs: Secondary | ICD-10-CM | POA: Diagnosis not present

## 2021-09-08 DIAGNOSIS — E1140 Type 2 diabetes mellitus with diabetic neuropathy, unspecified: Secondary | ICD-10-CM | POA: Insufficient documentation

## 2021-09-08 DIAGNOSIS — M79605 Pain in left leg: Secondary | ICD-10-CM | POA: Diagnosis not present

## 2021-09-08 DIAGNOSIS — I10 Essential (primary) hypertension: Secondary | ICD-10-CM | POA: Insufficient documentation

## 2021-09-08 NOTE — ED Triage Notes (Signed)
Pt via POV from home. Pt states he had sudden L leg pain that started in the L hip and travelled down his L leg. Pt states that it also has been swollen. Pt states that the pain and swelling started today. Pt has a hx of sciatica but states that this is not the same. Denies hx of blood clots. Pt is A&Ox4 and NAD. Denies blood thinners. Ambulatory to room.

## 2021-09-08 NOTE — ED Provider Notes (Addendum)
[facility] Emergency Department Provider Note   ____________________________________________   Event Date/Time   First MD Initiated Contact with Patient 09/08/21 2032     (approximate)  I have reviewed the triage vital signs and the nursing notes.   HISTORY  Chief Complaint Leg Pain and Leg Swelling (/)    HPI Brendan Barnes is a 80 y.o. male patient presents to the emergency room stating that he was driving to [place] this afternoon and began to have severe pain in his left leg.  The pain encompasses the entire leg from his buttocks down to his ankle.  Patient reports that the pain ran down the outer edge of full length of leg.  Patient son was concerned that he would have a DVT thus sending him to the emergency room for evaluation.  Patient has no redness/swelling to the left leg.  Patient was able to bear weight without pain.  Patient experienced no shortness of breath.  Patient reports that the pain has resolved at this time.  He is concerned that he may have piriformis.  He also reports that he has diagnosis of neuropathy and feels that this may be worsening.  The worsening of the neuropathy has been going on for several weeks now.  Past Medical History:  Diagnosis Date   BPH (benign prostatic hypertrophy)    Diabetes mellitus without complication ([facility])    ED (erectile dysfunction)    HLD (hyperlipidemia)    HTN (hypertension)    IBS (irritable bowel syndrome)    Osteoarthritis     Patient Active Problem List   Diagnosis Date Noted   Neural foraminal stenosis of lumbar spine 12/09/2019   Controlled type 2 diabetes mellitus with diabetic neuropathy ([place]) 05/19/2018   Advance directive discussed with patient 07/24/2016   Situational stress 07/24/2016   Routine general medical examination at a health care facility 02/19/2011   Osteoarthritis of back 02/04/2008   IRRITABLE BOWEL SYNDROME 08/07/2007   Hyperlipemia 02/06/2007   Essential  hypertension, benign 02/06/2007   BPH without obstruction/lower urinary tract symptoms 02/06/2007   SCIATICA 02/06/2007    Past Surgical History:  Procedure Laterality Date   COLONOSCOPY WITH PROPOFOL N/A 01/06/2017   Procedure: COLONOSCOPY WITH PROPOFOL;  Surgeon: Lollie Sails, MD;  Location: [facility] ENDOSCOPY;  Service: Endoscopy;  Laterality: N/A;   nasal "growth"  2000   toenail removal     US ECHOCARDIOGRAPHY  06/2005   LV EF normal, Mild LVH, LAE    Prior to Admission medications   Medication Sig Start Date End Date Taking? Authorizing Provider  amLODipine (NORVASC) 5 MG tablet TAKE 1 TABLET DAILY 05/22/20   Venia Carbon, MD  diclofenac (VOLTAREN) 75 MG EC tablet Take 1 tablet (75 mg total) by mouth as needed. 05/15/18   Venia Carbon, MD  finasteride (PROSCAR) 5 MG tablet TAKE 1 TABLET DAILY 03/29/20   Venia Carbon, MD  gabapentin (NEURONTIN) 100 MG capsule Take 1 capsule (100 mg total) by mouth 3 (three) times daily. 12/09/19   Viviana Simpler I, MD  glucose blood (FREESTYLE LITE) test strip Use to check blood sugar once daily. Dx Code E11.9 07/24/20   Viviana Simpler I, MD  lisinopril-hydrochlorothiazide (ZESTORETIC) 20-25 MG tablet TAKE 1 TABLET DAILY 05/22/20   Viviana Simpler I, MD  metFORMIN (GLUCOPHAGE-XR) 500 MG 24 hr tablet TAKE 2 TABLETS DAILY WITH BREAKFAST 05/25/20   Venia Carbon, MD  Multiple Vitamin (MULTIVITAMIN) tablet Take 1 tablet by mouth daily.  [provider]  polyethylene glycol (MIRALAX / GLYCOLAX) packet Take 17 g by mouth 3 (three) times a week.    [provider]  potassium chloride SA (KLOR-CON) 20 MEQ tablet Take 1 tablet (20 mEq total) by mouth 2 (two) times daily. NEEDS OFFICE VISIT 12/04/20   Viviana Simpler I, MD  simvastatin (ZOCOR) 20 MG tablet TAKE 1 TABLET DAILY 05/22/20   Venia Carbon, MD    Allergies Patient has no known allergies.  Family History  Problem Relation Age of Onset   Prostate cancer Father         prostate   Hyperlipidemia Father    Arrhythmia Father    Kidney failure Mother    Heart failure Mother    Gout Mother    Scoliosis Son     Social History Social History   Tobacco Use   Smoking status: Never   Smokeless tobacco: Never  Substance Use Topics   Alcohol use: Not Currently   Drug use: No    Review of Systems  Constitutional: No fever/chills Eyes: No visual changes. ENT: No sore throat. Cardiovascular: Denies chest pain. Respiratory: Denies shortness of breath. Gastrointestinal: No abdominal pain.  No nausea, no vomiting.  No diarrhea.  No constipation. Genitourinary: Negative for dysuria. Musculoskeletal patient reports that when he reported to the emergency room he had pain to the entire outer aspect of his left leg. Skin: Negative for rash. Neurological: Negative for headaches, focal weakness or numbness.   ____________________________________________   PHYSICAL EXAM:  VITAL SIGNS: ED Triage Vitals [09/08/21 1659]  Enc Vitals Group     BP (!) 179/58     Pulse Rate 64     Resp 20     Temp 98.2 F (36.8 C)     Temp Source Oral     SpO2 100 %     Weight      Height      Head Circumference      Peak Flow      Pain Score 0     Pain Loc      Pain Edu?      Excl. in [place]?     Constitutional: Alert and oriented. Well appearing and in no acute distress. Eyes: Conjunctivae are normal. PERRL. EOMI. Head: Atraumatic. Cardiovascular: Normal rate, regular rhythm. Grossly normal heart sounds.  Good peripheral circulation. Respiratory: Normal respiratory effort.  No retractions. Lungs CTAB. Gastrointestinal: Soft and nontender. No distention. No abdominal bruits. No CVA tenderness. Genitourinary: Patient denies any bowel or bladder incontinence. Musculoskeletal: Patient reports that at this time the pain in his left leg has resolved however when reporting to the emergency room initially he was having pain to the outer aspect of the entire left leg.   Patient also reports neuropathy to that leg/foot but this is chronic problem for him. Neurologic:  Normal speech and language. No gross focal neurologic deficits are appreciated. No gait instability. Skin:  Skin is warm, dry and intact. No rash noted. Psychiatric: Mood and affect are normal. Speech and behavior are normal.  ____________________________________________   LABS (all labs ordered are listed, but only abnormal results are displayed)  Labs Reviewed - No data to display ____________________________________________  EKG   ____________________________________________  RADIOLOGY  ED MD interpretation: Patient had ultrasound of the left lower extremity I reviewed that ultrasound and it was read by radiologist.  Official radiology report(s): US Venous Img Lower Unilateral Left  Result Date: 09/08/2021 CLINICAL DATA:  Leg pain and swelling.  EXAM: LEFT LOWER EXTREMITY VENOUS DOPPLER ULTRASOUND TECHNIQUE: Gray-scale sonography with compression, as well as color and duplex ultrasound, were performed to evaluate the deep venous system(s) from the level of the common femoral vein through the popliteal and proximal calf veins. COMPARISON:  None. FINDINGS: VENOUS Normal compressibility of the common femoral, superficial femoral, and popliteal veins, as well as the visualized calf veins. Visualized portions of profunda femoral vein and great saphenous vein unremarkable. No filling defects to suggest DVT on grayscale or color Doppler imaging. Doppler waveforms show normal direction of venous flow, normal respiratory plasticity and response to augmentation. Limited views of the contralateral common femoral vein are unremarkable. OTHER None. Limitations: none IMPRESSION: Negative. Electronically Signed   By: Dorise Bullion III M.D.   On: 09/08/2021 18:51    ____________________________________________   PROCEDURES  Procedure(s) performed: None  Procedures  Critical Care performed:  No  ____________________________________________   INITIAL IMPRESSION / ASSESSMENT AND PLAN / ED COURSE     Presents to the emergency room stating that while driving to [place] this afternoon he started to experience severe pain in the entire outer region of his left leg.  Patient reports that he has had sciatic pain before and this was not the same pain.  Patient's son was concerned and wanted him to come to the emergency room to be worked up for DVT.  She reports that over the past several weeks his neuropathy in the left leg and foot have gotten worse.  Please see HPI for further explanation. Patient received ultrasound of left lower extremity.  Ultrasound was negative for DVT. Patient states that he is no longer experiencing any pain in the left leg.  He also has no shortness of breath. Patient was concerned that he had piriformis but states that that his only concern with this was that his son has had it.  Patient has negative straight leg raise bilaterally. Patient reports that he does take Voltaren at home.  He is also on gabapentin.  Patient is questioning how often he can take the Voltaren.  I have informed him that he can take the Voltaren twice a day over the weekend.  He will then need to follow-up with his primary care doctor for further adjustment if needed.  I will defer treatment of increased neuropathy to his primary care doctor as well as adjustment of gabapentin dosing.  Patient is encouraged to return to the emergency room if he were to experience any sort of bowel or bladder incontinence, increase or return of pain.  When I went to discussed discharge with patient he then began to say he was having pain in his left buttocks that radiated down the back of his left leg he has positive straight leg raise to the left side.  I have informed him that I feel this is sciatic pain.  He should follow-up with his primary care doctor.  Patient is discharged in stable condition at this  time     ____________________________________________   FINAL CLINICAL IMPRESSION(S) / ED DIAGNOSES  Final diagnoses:  Left leg pain  Sciatica of left side     ED Discharge Orders     None        Note:  This document was prepared using Dragon voice recognition software and may include unintentional dictation errors.     Willaim Rayas, NP 09/08/21 2119    Willaim Rayas, NP 09/08/21 2130    Willaim Rayas, NP 09/08/21 2132  Nance Pear, MD 09/08/21 2146

## 2021-09-08 NOTE — Discharge Instructions (Addendum)
You have been seen in the emergency room today for left leg pain.  Your ultrasound was negative for DVT.  You may increase your Voltaren to twice a day over the weekend.  He will then need to follow-up with your primary care doctor for further instructions on how to take the Voltaren as well as treatment for the increased neuropathy you have mentioned in the emergency room tonight.

## 2021-09-08 NOTE — ED Triage Notes (Signed)
Pt called no response

## 2021-09-08 NOTE — ED Notes (Signed)
Patient stable and discharged with all personal belongings and AVS. AVS and discharge instructions reviewed with patient and opportunity for questions provided.   

## 2021-09-10 ENCOUNTER — Telehealth: Payer: Self-pay | Admitting: Internal Medicine

## 2021-09-10 ENCOUNTER — Ambulatory Visit: Payer: Medicare Other | Admitting: Family Medicine

## 2021-09-10 NOTE — Telephone Encounter (Signed)
Spoke to pt. Decided to make him an appt tomorrow with Dr Silvio Pate since he is PCP.

## 2021-09-10 NOTE — Telephone Encounter (Signed)
Which pharmacy: [facility] or [facility] mail order?

## 2021-09-10 NOTE — Telephone Encounter (Signed)
Pt wants them going to [facility]

## 2021-09-10 NOTE — Telephone Encounter (Signed)
  Encourage patient to contact the pharmacy for refills or they can request refills through [place]:  Please schedule appointment if longer than 1 year  NEXT APPOINTMENT DATE:09/25/21  MEDICATION:gabapentin, voltaren  Is the patient out of medication? yes  [facility] [place]  Let patient know to contact pharmacy at the end of the day to make sure medication is ready.  Please notify patient to allow 48-72 hours to process  CLINICAL FILLS OUT ALL BELOW:   LAST REFILL:  QTY:  REFILL DATE:    OTHER COMMENTS:    Okay for refill?  Please advise

## 2021-09-11 ENCOUNTER — Encounter: Payer: Self-pay | Admitting: Internal Medicine

## 2021-09-11 ENCOUNTER — Ambulatory Visit (INDEPENDENT_AMBULATORY_CARE_PROVIDER_SITE_OTHER): Payer: Medicare Other | Admitting: Internal Medicine

## 2021-09-11 ENCOUNTER — Telehealth: Payer: Self-pay | Admitting: Internal Medicine

## 2021-09-11 ENCOUNTER — Other Ambulatory Visit: Payer: Self-pay

## 2021-09-11 ENCOUNTER — Ambulatory Visit: Payer: Medicare Other | Admitting: Family Medicine

## 2021-09-11 VITALS — BP 122/74 | HR 73 | Temp 97.9°F | Ht 71.0 in | Wt 195.0 lb

## 2021-09-11 DIAGNOSIS — M21372 Foot drop, left foot: Secondary | ICD-10-CM | POA: Diagnosis not present

## 2021-09-11 DIAGNOSIS — E1140 Type 2 diabetes mellitus with diabetic neuropathy, unspecified: Secondary | ICD-10-CM | POA: Diagnosis not present

## 2021-09-11 DIAGNOSIS — N400 Enlarged prostate without lower urinary tract symptoms: Secondary | ICD-10-CM

## 2021-09-11 DIAGNOSIS — I10 Essential (primary) hypertension: Secondary | ICD-10-CM

## 2021-09-11 DIAGNOSIS — Z0000 Encounter for general adult medical examination without abnormal findings: Secondary | ICD-10-CM

## 2021-09-11 LAB — CBC
HCT: 41 % (ref 39.0–52.0)
Hemoglobin: 14.1 g/dL (ref 13.0–17.0)
MCHC: 34.5 g/dL (ref 30.0–36.0)
MCV: 87.4 fl (ref 78.0–100.0)
Platelets: 187 10*3/uL (ref 150.0–400.0)
RBC: 4.69 Mil/uL (ref 4.22–5.81)
RDW: 13 % (ref 11.5–15.5)
WBC: 5.9 10*3/uL (ref 4.0–10.5)

## 2021-09-11 LAB — RENAL FUNCTION PANEL
Albumin: 4.2 g/dL (ref 3.5–5.2)
BUN: 19 mg/dL (ref 6–23)
CO2: 32 mEq/L (ref 19–32)
Calcium: 9.5 mg/dL (ref 8.4–10.5)
Chloride: 97 mEq/L (ref 96–112)
Creatinine, Ser: 0.67 mg/dL (ref 0.40–1.50)
GFR: 88.19 mL/min (ref >60.00)
Glucose, Bld: 107 mg/dL — ABNORMAL HIGH (ref 70–99)
Phosphorus: 3.3 mg/dL (ref 2.3–4.6)
Potassium: 3.5 mEq/L (ref 3.5–5.1)
Sodium: 137 mEq/L (ref 135–145)

## 2021-09-11 LAB — HEPATIC FUNCTION PANEL
ALT: 14 U/L (ref 0–53)
AST: 19 U/L (ref 0–37)
Albumin: 4.2 g/dL (ref 3.5–5.2)
Alkaline Phosphatase: 47 U/L (ref 39–117)
Bilirubin, Direct: 0.3 mg/dL (ref 0.0–0.3)
Total Bilirubin: 2.2 mg/dL — ABNORMAL HIGH (ref 0.2–1.2)
Total Protein: 6.6 g/dL (ref 6.0–8.3)

## 2021-09-11 LAB — LIPID PANEL
Cholesterol: 160 mg/dL (ref 0–200)
HDL: 53.5 mg/dL (ref >39.00)
LDL Cholesterol: 83 mg/dL (ref 0–99)
NonHDL: 106.15
Total CHOL/HDL Ratio: 3
Triglycerides: 117 mg/dL (ref 0.0–149.0)
VLDL: 23.4 mg/dL (ref 0.0–40.0)

## 2021-09-11 LAB — HEMOGLOBIN A1C: Hgb A1c MFr Bld: 5.7 % (ref 4.6–6.5)

## 2021-09-11 LAB — T4, FREE: Free T4: 0.96 ng/dL (ref 0.60–1.60)

## 2021-09-11 MED ORDER — GABAPENTIN 300 MG PO CAPS: 300.0000 mg | ORAL_CAPSULE | Freq: Three times a day (TID) | ORAL | 3 refills | Status: DC

## 2021-09-11 MED ORDER — MELOXICAM 15 MG PO TBDP: 1.0000 | ORAL_TABLET | Freq: Every day | ORAL | 1 refills | Status: DC | PRN

## 2021-09-11 NOTE — Progress Notes (Signed)
Hearing Screening - Comments:: December 2021 Vision Screening - Comments:: October 2022

## 2021-09-11 NOTE — Assessment & Plan Note (Signed)
Voids okay on finasteride

## 2021-09-11 NOTE — Assessment & Plan Note (Addendum)
Unclear if peripheral or central (does have spinal stenosis) Will set up with neurology Renew meloxicam for prn use

## 2021-09-11 NOTE — Assessment & Plan Note (Signed)
I have personally reviewed the Medicare Annual Wellness questionnaire and have noted 1. The patient's medical and social history 2. Their use of alcohol, tobacco or illicit drugs 3. Their current medications and supplements 4. The patient's functional ability including ADL's, fall risks, home safety risks and hearing or visual             impairment. 5. Diet and physical activities 6. Evidence for depression or mood disorders  The patients weight, height, BMI and visual acuity have been recorded in the chart I have made referrals, counseling and provided education to the patient based review of the above and I have provided the pt with a written personalized care plan for preventive services.  I have provided you with a copy of your personalized plan for preventive services. Please take the time to review along with your updated medication list.  No cancer screening due to age Had bivalent COVID vaccine also Tries to do some exercise

## 2021-09-11 NOTE — Telephone Encounter (Signed)
Pt would like a call bk to discuss medication.

## 2021-09-11 NOTE — Assessment & Plan Note (Addendum)
BP Readings from Last 3 Encounters:  09/11/21 122/74  09/08/21 (!) 179/58  12/09/19 116/70   Okay on amlodipine and lisinopril/HCTZ

## 2021-09-11 NOTE — Assessment & Plan Note (Signed)
Seems to have reasonable control on metformin Will check A1c More neuropathy--may have another problem as well Will titrate the gabapentin

## 2021-09-11 NOTE — Progress Notes (Signed)
Subjective:    Patient ID: Brendan Barnes, male    DOB: 02-13-1941, 80 y.o.   MRN: [id number]  HPI Here for ER follow up and Medicare wellness visit This visit occurred during the SARS-CoV-2 public health emergency.  Safety protocols were in place, including screening questions prior to the visit, additional usage of staff PPE, and extensive cleaning of exam room while observing appropriate contact time as indicated for disinfecting solutions.   Reviewed advanced directives Reviewed other doctors--Dr Morales---physiatry, Dr Barbee Cough (now MD at [facility] [place]), Dr Morrison Old, Dr Pelletier--audiologist, Dr Jonna Clark, Dr Jola Babinski, Dr Jacelyn Grip No hospitalizations or surgery in the past year Does try to exercise No alcohol or tobacco Vision is okay Hearing is fine No falls---walks with rollator generally (cane at home at times) No depression or anhedonia---or rarely Independent with instrumental ADLs No memory problems  Had a tough time after wife's death Hasn't been in for a while  Recent trip in car to see son Generally takes voltaren/acetaminophen before ---to prevent back pain Developed severe pain in left leg and back/buttocks Son suggested ER evaluation for DVT Went to ER---no DVT. Diagnosed with neuropathy Now with more numbness in leg and "floppy" foot Tried the gabapentin again---not really helpful  Checking sugars regularly Usually under 110 Continues on metformin Ongoing neuropathy  No chest pain No palpitations No dizziness or syncope No edema--with compression hose  Voids okay Continues on finasteride Seems to empty okay Only occasional nocturia  Current Outpatient Medications on File Prior to Visit  Medication Sig Dispense Refill   amLODipine (NORVASC) 5 MG tablet TAKE 1 TABLET DAILY 90 tablet 3   diclofenac (VOLTAREN) 75 MG EC tablet Take 1 tablet (75 mg total) by mouth as needed. 90 tablet 0    finasteride (PROSCAR) 5 MG tablet TAKE 1 TABLET DAILY 90 tablet 3   gabapentin (NEURONTIN) 100 MG capsule Take 1 capsule (100 mg total) by mouth 3 (three) times daily. 90 capsule 3   glucose blood (FREESTYLE LITE) test strip Use to check blood sugar once daily. Dx Code E11.9 100 each 2   lisinopril-hydrochlorothiazide (ZESTORETIC) 20-25 MG tablet TAKE 1 TABLET DAILY 90 tablet 3   Meloxicam 15 MG TBDP Take 1 tablet by mouth daily.     metFORMIN (GLUCOPHAGE-XR) 500 MG 24 hr tablet TAKE 2 TABLETS DAILY WITH BREAKFAST 180 tablet 3   Multiple Vitamin (MULTIVITAMIN) tablet Take 1 tablet by mouth daily.     polyethylene glycol (MIRALAX / GLYCOLAX) packet Take 17 g by mouth 3 (three) times a week.     potassium chloride SA (KLOR-CON) 20 MEQ tablet Take 1 tablet (20 mEq total) by mouth 2 (two) times daily. NEEDS OFFICE VISIT 180 tablet 0   simvastatin (ZOCOR) 20 MG tablet TAKE 1 TABLET DAILY 90 tablet 3   No current facility-administered medications on file prior to visit.    No Known Allergies  Past Medical History:  Diagnosis Date   BPH (benign prostatic hypertrophy)    Diabetes mellitus without complication (HCC)    ED (erectile dysfunction)    HLD (hyperlipidemia)    HTN (hypertension)    IBS (irritable bowel syndrome)    Osteoarthritis     Past Surgical History:  Procedure Laterality Date   COLONOSCOPY WITH PROPOFOL N/A 01/06/2017   Procedure: COLONOSCOPY WITH PROPOFOL;  Surgeon: Lollie Sails, MD;  Location: [facility] ENDOSCOPY;  Service: Endoscopy;  Laterality: N/A;   nasal "growth"  2000   toenail removal  US ECHOCARDIOGRAPHY  06/2005   LV EF normal, Mild LVH, LAE    Family History  Problem Relation Age of Onset   Prostate cancer Father        prostate   Hyperlipidemia Father    Arrhythmia Father    Kidney failure Mother    Heart failure Mother    Gout Mother    Scoliosis Son     Social History   Socioeconomic History   Marital status: Widowed    Spouse name: Not  on file   Number of children: 2   Years of education: Not on file   Highest education level: Not on file  Occupational History   Occupation: Retired    Hydrologist: Social research officer, government, Designer, 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[employer]  Tobacco Use   Smoking status: Never   Smokeless tobacco: Never  Substance and Sexual Activity   Alcohol use: Not Currently   Drug use: No   Sexual activity: Not on file  Other Topics Concern   Not on file  Social History Narrative   Widowed 2021      Has living will   Son Garvin is his health care POA   Would accept resuscitation attempts   Might accept tube feeds based on situation   Social Determinants of Health   Financial Resource Strain: Not on file  Food Insecurity: Not on file  Transportation Needs: Not on file  Physical Activity: Not on file  Stress: Not on file  Social Connections: Not on file  Intimate Partner Violence: Not on file   Review of Systems Appetite is okay--appetite not as big Weight went down after wife died--now creeping up again Sleeps okay usually Wears seat belt Teeth okay Rare heartburn--some trouble swallowing in the afternoon (potassium pill) Bowels move okay--no blood. Can have fecal urgency though No suspicious skin lesions    Objective:   Physical Exam Constitutional:      Appearance: Normal appearance.  HENT:     Mouth/Throat:     Comments: No lesions Eyes:     Conjunctiva/sclera: Conjunctivae normal.     Pupils: Pupils are equal, round, and reactive to light.  Cardiovascular:     Rate and Rhythm: Normal rate and regular rhythm.     Heart sounds: No murmur heard.   No gallop.     Comments: Feet warm but no palpable pulses Pulmonary:     Effort: Pulmonary effort is normal.     Breath sounds: Normal breath sounds. No wheezing or rales.  Abdominal:     Palpations: Abdomen is soft.     Tenderness: There is no abdominal tenderness.  Musculoskeletal:     Cervical back: Neck supple.     Right lower leg: No edema.     Left lower leg: No edema.      Comments: varicosities  Lymphadenopathy:     Cervical: No cervical adenopathy.  Skin:    Findings: No rash.     Comments: No foot lesions  Neurological:     Mental Status: He is alert and oriented to person, place, and time.     Comments: [place]----?" [id number] D-[id number]-w Recall 3/3  Left foot drop Some sensory changes in feet Mild left leg weakness  Psychiatric:        Mood and Affect: Mood normal.        Behavior: Behavior normal.           Assessment & Plan:

## 2021-09-11 NOTE — Patient Instructions (Signed)
Please take the gabapentin 100mg  twice a day and 300mg  at bedtime. Every 2 days, increase the gabapentin by another 100mg  ---so next is 200/100/300, then 200/200/300, etc----till you get to 300mg  three times a day. If you are sleepy with this--cut back on the day time doses. If it is not helping, let me know and we can try to increase from there.  Continue the meloxicam as needed only.

## 2021-09-12 ENCOUNTER — Other Ambulatory Visit: Payer: Self-pay | Admitting: Internal Medicine

## 2021-09-12 NOTE — Telephone Encounter (Signed)
Left message on VM that I will call him back this afternoon.

## 2021-09-14 NOTE — Telephone Encounter (Signed)
Spoke to tp in office. Medications were sent to pharmacy a few days prior .

## 2021-09-24 DIAGNOSIS — R531 Weakness: Secondary | ICD-10-CM | POA: Diagnosis not present

## 2021-09-24 DIAGNOSIS — R262 Difficulty in walking, not elsewhere classified: Secondary | ICD-10-CM | POA: Diagnosis not present

## 2021-09-24 DIAGNOSIS — M21372 Foot drop, left foot: Secondary | ICD-10-CM | POA: Diagnosis not present

## 2021-09-24 DIAGNOSIS — R200 Anesthesia of skin: Secondary | ICD-10-CM | POA: Diagnosis not present

## 2021-09-24 DIAGNOSIS — M79605 Pain in left leg: Secondary | ICD-10-CM | POA: Diagnosis not present

## 2021-09-24 DIAGNOSIS — G479 Sleep disorder, unspecified: Secondary | ICD-10-CM | POA: Diagnosis not present

## 2021-09-24 DIAGNOSIS — R2689 Other abnormalities of gait and mobility: Secondary | ICD-10-CM | POA: Diagnosis not present

## 2021-09-25 ENCOUNTER — Ambulatory Visit: Payer: Medicare Other | Admitting: Internal Medicine

## 2021-09-26 DIAGNOSIS — D2272 Melanocytic nevi of left lower limb, including hip: Secondary | ICD-10-CM | POA: Diagnosis not present

## 2021-09-26 DIAGNOSIS — Z85828 Personal history of other malignant neoplasm of skin: Secondary | ICD-10-CM | POA: Diagnosis not present

## 2021-09-26 DIAGNOSIS — Z872 Personal history of diseases of the skin and subcutaneous tissue: Secondary | ICD-10-CM | POA: Diagnosis not present

## 2021-09-26 DIAGNOSIS — L821 Other seborrheic keratosis: Secondary | ICD-10-CM | POA: Diagnosis not present

## 2021-09-26 DIAGNOSIS — D2261 Melanocytic nevi of right upper limb, including shoulder: Secondary | ICD-10-CM | POA: Diagnosis not present

## 2021-09-26 DIAGNOSIS — D2262 Melanocytic nevi of left upper limb, including shoulder: Secondary | ICD-10-CM | POA: Diagnosis not present

## 2021-09-26 DIAGNOSIS — D485 Neoplasm of uncertain behavior of skin: Secondary | ICD-10-CM | POA: Diagnosis not present

## 2021-09-28 ENCOUNTER — Other Ambulatory Visit: Payer: Self-pay | Admitting: Physician Assistant

## 2021-09-28 DIAGNOSIS — G8929 Other chronic pain: Secondary | ICD-10-CM

## 2021-09-28 DIAGNOSIS — M21372 Foot drop, left foot: Secondary | ICD-10-CM

## 2021-09-28 DIAGNOSIS — R200 Anesthesia of skin: Secondary | ICD-10-CM

## 2021-09-28 DIAGNOSIS — R262 Difficulty in walking, not elsewhere classified: Secondary | ICD-10-CM

## 2021-09-28 DIAGNOSIS — R531 Weakness: Secondary | ICD-10-CM

## 2021-09-28 DIAGNOSIS — M545 Low back pain, unspecified: Secondary | ICD-10-CM

## 2021-10-01 DIAGNOSIS — E1159 Type 2 diabetes mellitus with other circulatory complications: Secondary | ICD-10-CM | POA: Diagnosis not present

## 2021-10-01 DIAGNOSIS — B351 Tinea unguium: Secondary | ICD-10-CM | POA: Diagnosis not present

## 2021-10-02 DIAGNOSIS — M6281 Muscle weakness (generalized): Secondary | ICD-10-CM | POA: Diagnosis not present

## 2021-10-02 DIAGNOSIS — R2681 Unsteadiness on feet: Secondary | ICD-10-CM | POA: Diagnosis not present

## 2021-10-02 DIAGNOSIS — R2689 Other abnormalities of gait and mobility: Secondary | ICD-10-CM | POA: Diagnosis not present

## 2021-10-02 DIAGNOSIS — M21372 Foot drop, left foot: Secondary | ICD-10-CM | POA: Diagnosis not present

## 2021-10-02 DIAGNOSIS — M545 Low back pain, unspecified: Secondary | ICD-10-CM | POA: Diagnosis not present

## 2021-10-08 DIAGNOSIS — M545 Low back pain, unspecified: Secondary | ICD-10-CM | POA: Diagnosis not present

## 2021-10-08 DIAGNOSIS — M21372 Foot drop, left foot: Secondary | ICD-10-CM | POA: Diagnosis not present

## 2021-10-08 DIAGNOSIS — R2681 Unsteadiness on feet: Secondary | ICD-10-CM | POA: Diagnosis not present

## 2021-10-08 DIAGNOSIS — R2689 Other abnormalities of gait and mobility: Secondary | ICD-10-CM | POA: Diagnosis not present

## 2021-10-08 DIAGNOSIS — M6281 Muscle weakness (generalized): Secondary | ICD-10-CM | POA: Diagnosis not present

## 2021-10-11 ENCOUNTER — Encounter: Payer: Self-pay | Admitting: Emergency Medicine

## 2021-10-11 ENCOUNTER — Ambulatory Visit (INDEPENDENT_AMBULATORY_CARE_PROVIDER_SITE_OTHER): Payer: Medicare Other

## 2021-10-11 ENCOUNTER — Other Ambulatory Visit: Payer: Self-pay

## 2021-10-11 ENCOUNTER — Ambulatory Visit
Admission: EM | Admit: 2021-10-11 | Discharge: 2021-10-11 | Disposition: A | Discharge status: Home / Self Care | Payer: Medicare Other | Attending: Emergency Medicine | Admitting: Emergency Medicine

## 2021-10-11 DIAGNOSIS — M25562 Pain in left knee: Secondary | ICD-10-CM

## 2021-10-11 DIAGNOSIS — W19XXXA Unspecified fall, initial encounter: Secondary | ICD-10-CM

## 2021-10-11 DIAGNOSIS — S8992XA Unspecified injury of left lower leg, initial encounter: Secondary | ICD-10-CM | POA: Diagnosis not present

## 2021-10-11 NOTE — Discharge Instructions (Addendum)
Rest and elevate your knee.  Apply ice packs 2-3 times a day for up to 20 minutes each.      Follow up with an orthopedist if your symptoms are not improving.

## 2021-10-11 NOTE — ED Triage Notes (Signed)
Pt states he has drop foot and stubbed his toe and landed on his left knee [place] the gas pump.

## 2021-10-11 NOTE — ED Provider Notes (Signed)
Brendan Barnes    CSN: [id number] Arrival date & time: 10/11/21  1036      History   Chief Complaint Chief Complaint  Patient presents with   Knee Injury     HPI Brendan Barnes is a 80 y.o. male.  Patient presents with left knee pain after he tripped and landed on his knee yesterday.  He has recent history of numbness in his left lower leg for >1 month and left foot drop.  No open wounds, new numbness, new weakness, or other symptoms.  Treating by using crutches to limit weight-bearing.  Patient was seen at [facility] ED on 09/08/2021; diagnosed with left leg pain and sciatica left side; ultrasound negative for DVT.  He was seen by neurology on 09/24/2021 for left foot drop and left leg pain.  His medical history includes hypertension and diabetes.  The history is provided by the patient and medical records.   Past Medical History:  Diagnosis Date   BPH (benign prostatic hypertrophy)    Diabetes mellitus without complication (HCC)    ED (erectile dysfunction)    HLD (hyperlipidemia)    HTN (hypertension)    IBS (irritable bowel syndrome)    Osteoarthritis     Patient Active Problem List   Diagnosis Date Noted   Left foot drop 09/11/2021   Neural foraminal stenosis of lumbar spine 12/09/2019   Controlled type 2 diabetes mellitus with diabetic neuropathy ([place]) 05/19/2018   Advance directive discussed with patient 07/24/2016   Situational stress 07/24/2016   Routine general medical examination at a health care facility 02/19/2011   Osteoarthritis of back 02/04/2008   IRRITABLE BOWEL SYNDROME 08/07/2007   Hyperlipemia 02/06/2007   Essential hypertension, benign 02/06/2007   BPH without obstruction/lower urinary tract symptoms 02/06/2007   SCIATICA 02/06/2007    Past Surgical History:  Procedure Laterality Date   COLONOSCOPY WITH PROPOFOL N/A 01/06/2017   Procedure: COLONOSCOPY WITH PROPOFOL;  Surgeon: Lollie Sails, MD;  Location: [facility] ENDOSCOPY;  Service:  Endoscopy;  Laterality: N/A;   nasal "growth"  2000   toenail removal     US ECHOCARDIOGRAPHY  06/2005   LV EF normal, Mild LVH, LAE       Home Medications    Prior to Admission medications   Medication Sig Start Date End Date Taking? Authorizing Provider  amLODipine (NORVASC) 5 MG tablet TAKE 1 TABLET DAILY 09/13/21   Viviana Simpler I, MD  finasteride (PROSCAR) 5 MG tablet TAKE 1 TABLET DAILY 09/13/21   Venia Carbon, MD  gabapentin (NEURONTIN) 300 MG capsule Take 1 capsule (300 mg total) by mouth 3 (three) times daily. 09/11/21   Viviana Simpler I, MD  glucose blood (FREESTYLE LITE) test strip USE TO CHECK BLOOD SUGAR ONCE DAILY 09/13/21   Viviana Simpler I, MD  lisinopril-hydrochlorothiazide (ZESTORETIC) 20-25 MG tablet TAKE 1 TABLET DAILY 09/13/21   Viviana Simpler I, MD  Meloxicam 15 MG TBDP Take 1 tablet by mouth daily as needed. 09/11/21   Venia Carbon, MD  metFORMIN (GLUCOPHAGE-XR) 500 MG 24 hr tablet TAKE 2 TABLETS DAILY WITH BREAKFAST 05/25/20   Venia Carbon, MD  Multiple Vitamin (MULTIVITAMIN) tablet Take 1 tablet by mouth daily.    [provider]  polyethylene glycol (MIRALAX / GLYCOLAX) packet Take 17 g by mouth 3 (three) times a week.    [provider]  potassium chloride SA (KLOR-CON) 20 MEQ tablet TAKE 1 TABLET TWICE A DAY (NEED OFFICE VISIT) 09/13/21   Viviana Simpler  I, MD  simvastatin (ZOCOR) 20 MG tablet TAKE 1 TABLET DAILY 05/22/20   Venia Carbon, MD    Family History Family History  Problem Relation Age of Onset   Prostate cancer Father        prostate   Hyperlipidemia Father    Arrhythmia Father    Kidney failure Mother    Heart failure Mother    Gout Mother    Scoliosis Son     Social History Social History   Tobacco Use   Smoking status: Never   Smokeless tobacco: Never  Vaping Use   Vaping Use: Never used  Substance Use Topics   Alcohol use: Not Currently   Drug use: No     Allergies   Patient has no  known allergies.   Review of Systems Review of Systems  Musculoskeletal:  Positive for arthralgias, gait problem and joint swelling.  Skin:  Negative for color change, rash and wound.  All other systems reviewed and are negative.   Physical Exam Triage Vital Signs ED Triage Vitals  Enc Vitals Group     BP      Pulse      Resp      Temp      Temp src      SpO2      Weight      Height      Head Circumference      Peak Flow      Pain Score      Pain Loc      Pain Edu?      Excl. in [place]?    No data found.  Updated Vital Signs BP (!) 159/81 (BP Location: Left Arm)   Pulse 85   Temp 98.1 F (36.7 C)   Resp 18   SpO2 98%   Visual Acuity Right Eye Distance:   Left Eye Distance:   Bilateral Distance:    Right Eye Near:   Left Eye Near:    Bilateral Near:     Physical Exam Vitals and nursing note reviewed.  Constitutional:      General: He is not in acute distress.    Appearance: He is well-developed.  Cardiovascular:     Rate and Rhythm: Normal rate and regular rhythm.     Heart sounds: Normal heart sounds.  Pulmonary:     Effort: Pulmonary effort is normal. No respiratory distress.     Breath sounds: Normal breath sounds.  Musculoskeletal:        General: Swelling and tenderness present. No deformity. Normal range of motion.     Cervical back: Neck supple.       Legs:     Comments: Left knee: FROM, mild edema; mild tenderness to palpation of anterior knee; no open wounds.  Skin:    General: Skin is warm and dry.     Capillary Refill: Capillary refill takes less than 2 seconds.  Neurological:     Mental Status: He is alert and oriented to person, place, and time.     Sensory: No sensory deficit.     Gait: Gait abnormal.     Comments: Ambulatory with crutches.  Psychiatric:        Mood and Affect: Mood normal.        Behavior: Behavior normal.     UC Treatments / Results  Labs (all labs ordered are listed, but only abnormal results are  displayed) Labs Reviewed - No data to display  EKG  Radiology DG Knee Complete 4 Views Left  Result Date: 10/11/2021 CLINICAL DATA:  Trauma, fall, pain EXAM: LEFT KNEE - COMPLETE 4+ VIEW COMPARISON:  None. FINDINGS: No recent fracture or dislocation is seen. There is cortical irregularity in the medial condyle of distal femur without definite break in the cortical margins. Bony spurs are noted in medial, lateral and patellofemoral compartments. There is soft tissue fullness in the suprapatellar bursa. There are scattered arterial calcifications. Osteopenia is seen in bony structures. IMPRESSION: No recent fracture or dislocation is seen in the left knee. Possible small effusion is present in suprapatellar bursa. Degenerative changes are noted in medial, lateral and patellofemoral compartments, more so in the medial [place] of distal left femur. Electronically Signed   By: Elmer Picker M.D.   On: 10/11/2021 11:07    Procedures Procedures (including critical care time)  Medications Ordered in UC Medications - No data to display  Initial Impression / Assessment and Plan / UC Course  I have reviewed the triage vital signs and the nursing notes.  Pertinent labs & imaging results that were available during my care of the patient were reviewed by me and considered in my medical decision making (see chart for details).   Acute pain of left knee.  Xray shows no fracture or dislocation; possible small effusion.  Discussed rest, elevation, ice packs.  Instructed patient to follow up with orthopedics if his symptoms are not improving.  Instructed him to follow-up with his neurologist for ongoing evaluation of his foot drop.  Patient states he is scheduled to be fitted for a foot brace later today.  He agrees to plan of care.    Final Clinical Impressions(s) / UC Diagnoses   Final diagnoses:  Acute pain of left knee     Discharge Instructions      Rest and elevate your knee.  Apply ice  packs 2-3 times a day for up to 20 minutes each.      Follow up with an orthopedist if your symptoms are not improving.        ED Prescriptions   None    PDMP not reviewed this encounter.   Sharion Balloon, NP 10/11/21 1135

## 2021-10-15 DIAGNOSIS — R2681 Unsteadiness on feet: Secondary | ICD-10-CM | POA: Diagnosis not present

## 2021-10-15 DIAGNOSIS — R2689 Other abnormalities of gait and mobility: Secondary | ICD-10-CM | POA: Diagnosis not present

## 2021-10-15 DIAGNOSIS — M21372 Foot drop, left foot: Secondary | ICD-10-CM | POA: Diagnosis not present

## 2021-10-15 DIAGNOSIS — M545 Low back pain, unspecified: Secondary | ICD-10-CM | POA: Diagnosis not present

## 2021-10-15 DIAGNOSIS — M6281 Muscle weakness (generalized): Secondary | ICD-10-CM | POA: Diagnosis not present

## 2021-10-18 ENCOUNTER — Other Ambulatory Visit: Payer: Self-pay

## 2021-10-18 ENCOUNTER — Ambulatory Visit
Admission: RE | Admit: 2021-10-18 | Discharge: 2021-10-18 | Disposition: A | Discharge status: Home / Self Care | Payer: Medicare Other | Source: Ambulatory Visit | Attending: Physician Assistant | Admitting: Physician Assistant

## 2021-10-18 DIAGNOSIS — R262 Difficulty in walking, not elsewhere classified: Secondary | ICD-10-CM

## 2021-10-18 DIAGNOSIS — R531 Weakness: Secondary | ICD-10-CM

## 2021-10-18 DIAGNOSIS — M48061 Spinal stenosis, lumbar region without neurogenic claudication: Secondary | ICD-10-CM | POA: Diagnosis not present

## 2021-10-18 DIAGNOSIS — M21372 Foot drop, left foot: Secondary | ICD-10-CM

## 2021-10-18 DIAGNOSIS — M545 Low back pain, unspecified: Secondary | ICD-10-CM

## 2021-10-18 DIAGNOSIS — R200 Anesthesia of skin: Secondary | ICD-10-CM | POA: Diagnosis not present

## 2021-10-22 DIAGNOSIS — M21372 Foot drop, left foot: Secondary | ICD-10-CM | POA: Diagnosis not present

## 2021-10-22 DIAGNOSIS — R2689 Other abnormalities of gait and mobility: Secondary | ICD-10-CM | POA: Diagnosis not present

## 2021-10-22 DIAGNOSIS — M6281 Muscle weakness (generalized): Secondary | ICD-10-CM | POA: Diagnosis not present

## 2021-10-22 DIAGNOSIS — M545 Low back pain, unspecified: Secondary | ICD-10-CM | POA: Diagnosis not present

## 2021-10-22 DIAGNOSIS — R2681 Unsteadiness on feet: Secondary | ICD-10-CM | POA: Diagnosis not present

## 2021-10-25 ENCOUNTER — Other Ambulatory Visit: Payer: Self-pay | Admitting: Internal Medicine

## 2021-10-26 DIAGNOSIS — R2681 Unsteadiness on feet: Secondary | ICD-10-CM | POA: Diagnosis not present

## 2021-10-26 DIAGNOSIS — M21372 Foot drop, left foot: Secondary | ICD-10-CM | POA: Diagnosis not present

## 2021-10-26 DIAGNOSIS — M6281 Muscle weakness (generalized): Secondary | ICD-10-CM | POA: Diagnosis not present

## 2021-10-26 DIAGNOSIS — R2689 Other abnormalities of gait and mobility: Secondary | ICD-10-CM | POA: Diagnosis not present

## 2021-10-26 DIAGNOSIS — M545 Low back pain, unspecified: Secondary | ICD-10-CM | POA: Diagnosis not present

## 2021-11-01 DIAGNOSIS — M545 Low back pain, unspecified: Secondary | ICD-10-CM | POA: Diagnosis not present

## 2021-11-01 DIAGNOSIS — M21372 Foot drop, left foot: Secondary | ICD-10-CM | POA: Diagnosis not present

## 2021-11-01 DIAGNOSIS — M6281 Muscle weakness (generalized): Secondary | ICD-10-CM | POA: Diagnosis not present

## 2021-11-01 DIAGNOSIS — R2689 Other abnormalities of gait and mobility: Secondary | ICD-10-CM | POA: Diagnosis not present

## 2021-11-01 DIAGNOSIS — R2681 Unsteadiness on feet: Secondary | ICD-10-CM | POA: Diagnosis not present

## 2021-11-02 DIAGNOSIS — M6281 Muscle weakness (generalized): Secondary | ICD-10-CM | POA: Diagnosis not present

## 2021-11-02 DIAGNOSIS — R2689 Other abnormalities of gait and mobility: Secondary | ICD-10-CM | POA: Diagnosis not present

## 2021-11-02 DIAGNOSIS — R2681 Unsteadiness on feet: Secondary | ICD-10-CM | POA: Diagnosis not present

## 2021-11-02 DIAGNOSIS — M21372 Foot drop, left foot: Secondary | ICD-10-CM | POA: Diagnosis not present

## 2021-11-02 DIAGNOSIS — M545 Low back pain, unspecified: Secondary | ICD-10-CM | POA: Diagnosis not present

## 2021-11-05 DIAGNOSIS — R2681 Unsteadiness on feet: Secondary | ICD-10-CM | POA: Diagnosis not present

## 2021-11-05 DIAGNOSIS — R2689 Other abnormalities of gait and mobility: Secondary | ICD-10-CM | POA: Diagnosis not present

## 2021-11-05 DIAGNOSIS — L820 Inflamed seborrheic keratosis: Secondary | ICD-10-CM | POA: Diagnosis not present

## 2021-11-05 DIAGNOSIS — D492 Neoplasm of unspecified behavior of bone, soft tissue, and skin: Secondary | ICD-10-CM | POA: Diagnosis not present

## 2021-11-05 DIAGNOSIS — L570 Actinic keratosis: Secondary | ICD-10-CM | POA: Diagnosis not present

## 2021-11-05 DIAGNOSIS — M21372 Foot drop, left foot: Secondary | ICD-10-CM | POA: Diagnosis not present

## 2021-11-05 DIAGNOSIS — L821 Other seborrheic keratosis: Secondary | ICD-10-CM | POA: Diagnosis not present

## 2021-11-05 DIAGNOSIS — M545 Low back pain, unspecified: Secondary | ICD-10-CM | POA: Diagnosis not present

## 2021-11-05 DIAGNOSIS — M6281 Muscle weakness (generalized): Secondary | ICD-10-CM | POA: Diagnosis not present

## 2021-11-07 DIAGNOSIS — R29898 Other symptoms and signs involving the musculoskeletal system: Secondary | ICD-10-CM | POA: Diagnosis not present

## 2021-11-09 DIAGNOSIS — R2689 Other abnormalities of gait and mobility: Secondary | ICD-10-CM | POA: Diagnosis not present

## 2021-11-09 DIAGNOSIS — M6281 Muscle weakness (generalized): Secondary | ICD-10-CM | POA: Diagnosis not present

## 2021-11-09 DIAGNOSIS — M21372 Foot drop, left foot: Secondary | ICD-10-CM | POA: Diagnosis not present

## 2021-11-09 DIAGNOSIS — R2681 Unsteadiness on feet: Secondary | ICD-10-CM | POA: Diagnosis not present

## 2021-11-09 DIAGNOSIS — M545 Low back pain, unspecified: Secondary | ICD-10-CM | POA: Diagnosis not present

## 2021-11-15 ENCOUNTER — Encounter: Payer: Self-pay | Admitting: Internal Medicine

## 2021-11-15 ENCOUNTER — Telehealth: Payer: Self-pay | Admitting: Internal Medicine

## 2021-11-15 NOTE — Telephone Encounter (Signed)
Mr. Nakia called in and stated that he needed a referral for a urologist. He saw that someone in [facility] but has since retired. But would like someone [place].

## 2021-11-15 NOTE — Telephone Encounter (Signed)
Spoke to pt. He tried to make an appt but they said he needed a referral. He is having hematuria. I set him up with Eugenia Pancoast tomorrow at 2. Confirmed BS.

## 2021-11-16 ENCOUNTER — Encounter: Payer: Self-pay | Admitting: Family

## 2021-11-16 ENCOUNTER — Ambulatory Visit (INDEPENDENT_AMBULATORY_CARE_PROVIDER_SITE_OTHER): Payer: Medicare Other | Admitting: Family

## 2021-11-16 ENCOUNTER — Encounter: Payer: Self-pay | Admitting: *Deleted

## 2021-11-16 ENCOUNTER — Other Ambulatory Visit: Payer: Self-pay | Admitting: Family

## 2021-11-16 ENCOUNTER — Other Ambulatory Visit: Payer: Self-pay

## 2021-11-16 VITALS — BP 146/72 | HR 76 | Temp 97.8°F | Ht 71.0 in | Wt 194.0 lb

## 2021-11-16 DIAGNOSIS — M5441 Lumbago with sciatica, right side: Secondary | ICD-10-CM | POA: Diagnosis not present

## 2021-11-16 DIAGNOSIS — E1140 Type 2 diabetes mellitus with diabetic neuropathy, unspecified: Secondary | ICD-10-CM | POA: Diagnosis not present

## 2021-11-16 DIAGNOSIS — N400 Enlarged prostate without lower urinary tract symptoms: Secondary | ICD-10-CM

## 2021-11-16 DIAGNOSIS — M5442 Lumbago with sciatica, left side: Secondary | ICD-10-CM

## 2021-11-16 DIAGNOSIS — I872 Venous insufficiency (chronic) (peripheral): Secondary | ICD-10-CM

## 2021-11-16 DIAGNOSIS — R972 Elevated prostate specific antigen [PSA]: Secondary | ICD-10-CM | POA: Diagnosis not present

## 2021-11-16 DIAGNOSIS — M545 Low back pain, unspecified: Secondary | ICD-10-CM | POA: Diagnosis not present

## 2021-11-16 DIAGNOSIS — R319 Hematuria, unspecified: Secondary | ICD-10-CM

## 2021-11-16 DIAGNOSIS — R350 Frequency of micturition: Secondary | ICD-10-CM

## 2021-11-16 DIAGNOSIS — Z8042 Family history of malignant neoplasm of prostate: Secondary | ICD-10-CM

## 2021-11-16 DIAGNOSIS — G8929 Other chronic pain: Secondary | ICD-10-CM

## 2021-11-16 DIAGNOSIS — M21372 Foot drop, left foot: Secondary | ICD-10-CM | POA: Diagnosis not present

## 2021-11-16 DIAGNOSIS — R809 Proteinuria, unspecified: Secondary | ICD-10-CM

## 2021-11-16 DIAGNOSIS — R2689 Other abnormalities of gait and mobility: Secondary | ICD-10-CM | POA: Diagnosis not present

## 2021-11-16 DIAGNOSIS — M6281 Muscle weakness (generalized): Secondary | ICD-10-CM | POA: Diagnosis not present

## 2021-11-16 DIAGNOSIS — R2681 Unsteadiness on feet: Secondary | ICD-10-CM | POA: Diagnosis not present

## 2021-11-16 LAB — POCT URINALYSIS DIP (MANUAL ENTRY)
Bilirubin, UA: NEGATIVE
Blood, UA: NEGATIVE
Glucose, UA: NEGATIVE mg/dL
Nitrite, UA: NEGATIVE
Spec Grav, UA: 1.015 (ref 1.010–1.025)
Urobilinogen, UA: 1 E.U./dL
pH, UA: 6 (ref 5.0–8.0)

## 2021-11-16 NOTE — Assessment & Plan Note (Signed)
Completed order for diabetic shoes that were referred to by the podiatrist.  We will fax tablet order to get this water started.  Patient to continue with current medication work on diabetic diet and follow-up annually with for his eye exam.  Eye exam up-to-date as he just had this completed in 08/2021

## 2021-11-16 NOTE — Assessment & Plan Note (Signed)
Patient to continue follow-up with his neurosurgeon as scheduled he has an appointment coming up soon.

## 2021-11-16 NOTE — Assessment & Plan Note (Signed)
Diagnosis assessed and made by podiatry.  Patient continue with compression socks as ordered by podiatrist.  Also ordering diabetic shoes that will help with comfort level as well as neuropathy, will fax order today

## 2021-11-16 NOTE — Patient Instructions (Signed)
A referral was placed today for urology. Please let us know if you have not heard back within 1 week about your referral.  I collected a urine culture today as well as urinalysis, I will let you know if any bacteria grows.   It was a pleasure seeing you today! Please do not hesitate to reach out with any questions and or concerns.  Regards,   Eugenia Pancoast FNP-C

## 2021-11-16 NOTE — Addendum Note (Signed)
Addended by: Neta Ehlers on: 11/16/2021 03:55 PM   Modules accepted: Orders

## 2021-11-16 NOTE — Assessment & Plan Note (Signed)
Urine dipstick today with with negative hematuria and trace leukocytes.  Will send for urinalysis as well as urine culture pending results to choose to treat or not.  Asymptomatic for kidney stone.

## 2021-11-16 NOTE — Assessment & Plan Note (Signed)
Urologist not a practice still have pending referral for urology per patient request.  Continue current medications as well.

## 2021-11-16 NOTE — Progress Notes (Signed)
Established Patient Office Visit  Subjective:  Patient ID: Brendan Barnes, male    DOB: Mar 04, 1941  Age: 81 y.o. MRN: [id number]  CC:  Chief Complaint  Patient presents with   Hematuria    HPI Brendan Barnes is here today with concerns.   Three days ago, Wednesday, upon getting up he noticed some blood in his urine as well as dyrusia first thing in the morning. He does have a urologist, and gave them a call (with [facility]), and found out that his prior urologist had left the office and that he would have to get another referral to see a urologist again. No longer with dysuria, but does note increased urinary frequency and urgency that he has also noticed since Wednesday. He feels the needs to go 'all the time' which is abnormal for him. No fever no chills. Does have back pain but seems unrelated because also seeing neurosurgeon for chronic back pain.   Pt currently wearing ATO on left side of leg due to left foot drop (chronic), which requires him to have bigger shoes to help walk and wear shoes comfortably with the ATO . Followed by Dr. Cornelia Copa, neurology for this. Pt does state he has DM with peripheral neuropathy In bil LE, that is further worsened by chronic bil lower back with sciatica/ radiculopathy to the left side. He does take gabapentin 300 mg TID for neuropathy as well as meloxicam that helps with the pain (15 mg prn) as well.   Saw podiatry 08/08/21 with noted venous insufficiency / was recommended to start wearing daily compression stockings for bil LE edema which patient states is helpful and has improved edema. Podiatry recommended extra depth shoes and custom molded inserts.   Past Medical History:  Diagnosis Date   BPH (benign prostatic hypertrophy)    Diabetes mellitus without complication (HCC)    ED (erectile dysfunction)    HLD (hyperlipidemia)    HTN (hypertension)    IBS (irritable bowel syndrome)    Osteoarthritis     Past Surgical History:  Procedure Laterality  Date   COLONOSCOPY WITH PROPOFOL N/A 01/06/2017   Procedure: COLONOSCOPY WITH PROPOFOL;  Surgeon: Lollie Sails, MD;  Location: [facility] ENDOSCOPY;  Service: Endoscopy;  Laterality: N/A;   nasal "growth"  2000   toenail removal     US ECHOCARDIOGRAPHY  06/2005   LV EF normal, Mild LVH, LAE    Family History  Problem Relation Age of Onset   Prostate cancer Father        prostate   Hyperlipidemia Father    Arrhythmia Father    Kidney failure Mother    Heart failure Mother    Gout Mother    Scoliosis Son     Social History   Socioeconomic History   Marital status: Widowed    Spouse name: Not on file   Number of children: 2   Years of education: Not on file   Highest education level: Not on file  Occupational History   Occupation: Retired    Hydrologist: Social research officer, government, Designer, 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[employer]  Tobacco Use   Smoking status: Never   Smokeless tobacco: Never  Vaping Use   Vaping Use: Never used  Substance and Sexual Activity   Alcohol use: Not Currently   Drug use: No   Sexual activity: Not on file  Other Topics Concern   Not on file  Social History Narrative   Widowed 2021      Has living will   Son Jaydyn  is his health care POA   Would accept resuscitation attempts   Might accept tube feeds based on situation   Social Determinants of Health   Financial Resource Strain: Not on file  Food Insecurity: Not on file  Transportation Needs: Not on file  Physical Activity: Not on file  Stress: Not on file  Social Connections: Not on file  Intimate Partner Violence: Not on file    Outpatient Medications Prior to Visit  Medication Sig Dispense Refill   amLODipine (NORVASC) 5 MG tablet TAKE 1 TABLET DAILY 90 tablet 3   finasteride (PROSCAR) 5 MG tablet TAKE 1 TABLET DAILY 90 tablet 3   gabapentin (NEURONTIN) 300 MG capsule Take 1 capsule (300 mg total) by mouth 3 (three) times daily. 90 capsule 3   glucose blood (FREESTYLE LITE) test strip USE TO CHECK BLOOD SUGAR ONCE DAILY 100 strip 3    lisinopril-hydrochlorothiazide (ZESTORETIC) 20-25 MG tablet TAKE 1 TABLET DAILY 90 tablet 3   meloxicam (MOBIC) 15 MG tablet TAKE 1 TABLET BY MOUTH DAILY AS NEEDED 30 tablet 0   metFORMIN (GLUCOPHAGE-XR) 500 MG 24 hr tablet TAKE 2 TABLETS DAILY WITH BREAKFAST 180 tablet 3   Multiple Vitamin (MULTIVITAMIN) tablet Take 1 tablet by mouth daily.     polyethylene glycol (MIRALAX / GLYCOLAX) packet Take 17 g by mouth 3 (three) times a week.     potassium chloride SA (KLOR-CON) 20 MEQ tablet TAKE 1 TABLET TWICE A DAY (NEED OFFICE VISIT) 180 tablet 3   simvastatin (ZOCOR) 20 MG tablet TAKE 1 TABLET DAILY 90 tablet 3   No facility-administered medications prior to visit.    No Known Allergies  ROS Review of Systems  Constitutional:  Negative for chills and fever.  Genitourinary:  Positive for dysuria, frequency, hematuria and urgency. Negative for testicular pain.  Musculoskeletal:  Negative for back pain.  Neurological:  Positive for numbness. Seizures: lle numbness, currently in ATO brace, states chronic.    Objective:    Physical Exam Constitutional:      General: He is not in acute distress.    Appearance: Normal appearance. He is normal weight. He is not ill-appearing, toxic-appearing or diaphoretic.  Cardiovascular:     Comments: Wearing compression socks Wearing brace left lower leg Abdominal:     General: Abdomen is flat.     Palpations: Abdomen is soft.     Tenderness: There is no abdominal tenderness.  Musculoskeletal:     Right lower leg: 1+ Edema present.     Left lower leg: 1+ Edema present.  Neurological:     General: No focal deficit present.     Mental Status: He is alert.    BP (!) 146/72    Pulse 76    Temp 97.8 F (36.6 C) (Temporal)    Ht 5\' 11"  (1.803 m)    Wt 194 lb (88 kg)    SpO2 96%    BMI 27.06 kg/m  Wt Readings from Last 3 Encounters:  11/16/21 194 lb (88 kg)  09/11/21 195 lb (88.5 kg)  12/09/19 198 lb (89.8 kg)     There are no preventive care  reminders to display for this patient.   There are no preventive care reminders to display for this patient.  Lab Results  Component Value Date   TSH 1.26 07/07/2013   Lab Results  Component Value Date   WBC 5.9 09/11/2021   HGB 14.1 09/11/2021   HCT 41.0 09/11/2021   MCV 87.4 09/11/2021  PLT 187.0 09/11/2021   Lab Results  Component Value Date   NA 137 09/11/2021   K 3.5 09/11/2021   CO2 32 09/11/2021   GLUCOSE 107 (H) 09/11/2021   BUN 19 09/11/2021   CREATININE 0.67 09/11/2021   BILITOT 2.2 (H) 09/11/2021   ALKPHOS 47 09/11/2021   AST 19 09/11/2021   ALT 14 09/11/2021   PROT 6.6 09/11/2021   ALBUMIN 4.2 09/11/2021   ALBUMIN 4.2 09/11/2021   CALCIUM 9.5 09/11/2021   ANIONGAP 7 01/11/2014   GFR 88.19 09/11/2021   Lab Results  Component Value Date   HGBA1C 5.7 09/11/2021      Assessment & Plan:   Problem List Items Addressed This Visit       Cardiovascular and Mediastinum   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  Patient continue with compression socks as ordered by podiatrist.  Also ordering diabetic shoes that will help with comfort level as well as neuropathy, will fax order today        Endocrine   Controlled type 2 diabetes mellitus with diabetic neuropathy ([place])    Completed order for diabetic shoes that were referred to by the podiatrist.  We will fax tablet order to get this water started.  Patient to continue with current medication work on diabetic diet and follow-up annually with for his eye exam.  Eye exam up-to-date as he just had this completed in 08/2021        Nervous and Auditory   Chronic bilateral low back pain with bilateral sciatica    Patient to continue follow-up with his neurosurgeon as scheduled he has an appointment coming up soon.          Genitourinary   BPH without obstruction/lower urinary tract symptoms    Urologist not a practice still have pending referral  for urology per patient request.  Continue current medications as well.      Relevant Orders   Ambulatory referral to Urology     Other   Hematuria - Primary    Urine dipstick today with with negative hematuria and trace leukocytes.  Will send for urinalysis as well as urine culture pending results to choose to treat or not.  Asymptomatic for kidney stone.      Relevant Orders   POCT urinalysis dipstick (Completed)   Urine Culture   Urinalysis, Routine w reflex microscopic   Ambulatory referral to Urology   Elevated PSA   Relevant Orders   Ambulatory referral to Urology   Family history of malignant neoplasm of prostate   Relevant Orders   Ambulatory referral to Urology    No orders of the defined types were placed in this encounter.   Follow-up: Return if symptoms worsen or fail to improve.    Eugenia Pancoast, FNP

## 2021-11-17 LAB — URINALYSIS, ROUTINE W REFLEX MICROSCOPIC
Bacteria, UA: NONE SEEN /HPF
Bilirubin Urine: NEGATIVE
Glucose, UA: NEGATIVE
Hgb urine dipstick: NEGATIVE
Hyaline Cast: NONE SEEN /LPF
Ketones, ur: NEGATIVE
Nitrite: NEGATIVE
Protein, ur: NEGATIVE
RBC / HPF: NONE SEEN /HPF (ref 0–2)
Specific Gravity, Urine: 1.01 (ref 1.001–1.035)
Squamous Epithelial / HPF: NONE SEEN /HPF (ref <=5)
pH: 5.5 (ref 5.0–8.0)

## 2021-11-17 LAB — URINE CULTURE
MICRO NUMBER:: 12868713
Result:: NO GROWTH
SPECIMEN QUALITY:: ADEQUATE

## 2021-11-17 LAB — MICROSCOPIC MESSAGE

## 2021-11-17 LAB — EXTRA URINE SPECIMEN

## 2021-11-19 ENCOUNTER — Encounter: Payer: Self-pay | Admitting: *Deleted

## 2021-11-19 DIAGNOSIS — R2689 Other abnormalities of gait and mobility: Secondary | ICD-10-CM | POA: Diagnosis not present

## 2021-11-19 DIAGNOSIS — M6281 Muscle weakness (generalized): Secondary | ICD-10-CM | POA: Diagnosis not present

## 2021-11-19 DIAGNOSIS — M545 Low back pain, unspecified: Secondary | ICD-10-CM | POA: Diagnosis not present

## 2021-11-19 DIAGNOSIS — M21372 Foot drop, left foot: Secondary | ICD-10-CM | POA: Diagnosis not present

## 2021-11-19 DIAGNOSIS — R2681 Unsteadiness on feet: Secondary | ICD-10-CM | POA: Diagnosis not present

## 2021-11-20 DIAGNOSIS — R202 Paresthesia of skin: Secondary | ICD-10-CM | POA: Diagnosis not present

## 2021-11-20 DIAGNOSIS — R262 Difficulty in walking, not elsewhere classified: Secondary | ICD-10-CM | POA: Diagnosis not present

## 2021-11-20 DIAGNOSIS — G5732 Lesion of lateral popliteal nerve, left lower limb: Secondary | ICD-10-CM | POA: Diagnosis not present

## 2021-11-20 DIAGNOSIS — R200 Anesthesia of skin: Secondary | ICD-10-CM | POA: Diagnosis not present

## 2021-11-20 DIAGNOSIS — M21372 Foot drop, left foot: Secondary | ICD-10-CM | POA: Diagnosis not present

## 2021-11-23 DIAGNOSIS — M47816 Spondylosis without myelopathy or radiculopathy, lumbar region: Secondary | ICD-10-CM | POA: Diagnosis not present

## 2021-11-23 DIAGNOSIS — M5416 Radiculopathy, lumbar region: Secondary | ICD-10-CM | POA: Diagnosis not present

## 2021-11-26 DIAGNOSIS — M545 Low back pain, unspecified: Secondary | ICD-10-CM | POA: Diagnosis not present

## 2021-11-26 DIAGNOSIS — R2689 Other abnormalities of gait and mobility: Secondary | ICD-10-CM | POA: Diagnosis not present

## 2021-11-26 DIAGNOSIS — M21372 Foot drop, left foot: Secondary | ICD-10-CM | POA: Diagnosis not present

## 2021-11-26 DIAGNOSIS — M6281 Muscle weakness (generalized): Secondary | ICD-10-CM | POA: Diagnosis not present

## 2021-11-26 DIAGNOSIS — R2681 Unsteadiness on feet: Secondary | ICD-10-CM | POA: Diagnosis not present

## 2021-12-04 ENCOUNTER — Other Ambulatory Visit: Payer: Self-pay | Admitting: Internal Medicine

## 2021-12-05 ENCOUNTER — Other Ambulatory Visit: Payer: Self-pay

## 2021-12-05 ENCOUNTER — Encounter: Payer: Self-pay | Admitting: Internal Medicine

## 2021-12-05 ENCOUNTER — Ambulatory Visit (INDEPENDENT_AMBULATORY_CARE_PROVIDER_SITE_OTHER): Payer: Medicare Other | Admitting: Internal Medicine

## 2021-12-05 ENCOUNTER — Ambulatory Visit
Admission: RE | Admit: 2021-12-05 | Discharge: 2021-12-05 | Disposition: A | Discharge status: Home / Self Care | Payer: Medicare Other | Source: Ambulatory Visit | Attending: Family | Admitting: Family

## 2021-12-05 DIAGNOSIS — R809 Proteinuria, unspecified: Secondary | ICD-10-CM | POA: Insufficient documentation

## 2021-12-05 DIAGNOSIS — M778 Other enthesopathies, not elsewhere classified: Secondary | ICD-10-CM | POA: Diagnosis not present

## 2021-12-05 DIAGNOSIS — R350 Frequency of micturition: Secondary | ICD-10-CM | POA: Diagnosis not present

## 2021-12-05 DIAGNOSIS — R319 Hematuria, unspecified: Secondary | ICD-10-CM | POA: Insufficient documentation

## 2021-12-05 DIAGNOSIS — M48061 Spinal stenosis, lumbar region without neurogenic claudication: Secondary | ICD-10-CM | POA: Diagnosis not present

## 2021-12-05 DIAGNOSIS — N281 Cyst of kidney, acquired: Secondary | ICD-10-CM | POA: Diagnosis not present

## 2021-12-05 NOTE — Assessment & Plan Note (Addendum)
Left L4 lesion likely related to foot drop May not improve but makes sense to go ahead with repair Repair planned also for spinal stenosis MRI and Dr Clarice Pole notes reviewed Discussed pros and cons with him

## 2021-12-05 NOTE — Progress Notes (Signed)
Subjective:    Patient ID: Brendan Barnes, male    DOB: Mar 15, 1941, 81 y.o.   MRN: [id number]  HPI Here for follow up of foot drop and other issues  Saw neurosurgeon--Dr Elsner MRI did show bulging disc at L4 and bad spinal stenosis Surgeon plans full repair of the stenosis (and poor prognosis for foot drop resolving) Mild stable pain with walking---even after hours (and standing) Has pain at night in bed--will have bad pain in left leg and ankle Gabapentin wears off some of time  Has appt with urologist coming up Has bladder ultrasound today  Having pain in right shoulder Goes down arm and to wrist at times  Current Outpatient Medications on File Prior to Visit  Medication Sig Dispense Refill   amLODipine (NORVASC) 5 MG tablet TAKE 1 TABLET DAILY 90 tablet 3   finasteride (PROSCAR) 5 MG tablet TAKE 1 TABLET DAILY 90 tablet 3   gabapentin (NEURONTIN) 300 MG capsule Take 1 capsule (300 mg total) by mouth 3 (three) times daily. 90 capsule 3   glucose blood (FREESTYLE LITE) test strip USE TO CHECK BLOOD SUGAR ONCE DAILY 100 strip 3   lisinopril-hydrochlorothiazide (ZESTORETIC) 20-25 MG tablet TAKE 1 TABLET DAILY 90 tablet 3   meloxicam (MOBIC) 15 MG tablet TAKE 1 TABLET BY MOUTH DAILY AS NEEDED 30 tablet 0   metFORMIN (GLUCOPHAGE-XR) 500 MG 24 hr tablet TAKE 2 TABLETS DAILY WITH BREAKFAST 180 tablet 3   Multiple Vitamin (MULTIVITAMIN) tablet Take 1 tablet by mouth daily.     polyethylene glycol (MIRALAX / GLYCOLAX) packet Take 17 g by mouth 3 (three) times a week.     potassium chloride SA (KLOR-CON) 20 MEQ tablet TAKE 1 TABLET TWICE A DAY (NEED OFFICE VISIT) 180 tablet 3   simvastatin (ZOCOR) 20 MG tablet TAKE 1 TABLET DAILY 90 tablet 3   No current facility-administered medications on file prior to visit.    Not on File  Past Medical History:  Diagnosis Date   BPH (benign prostatic hypertrophy)    Diabetes mellitus without complication (HCC)    ED (erectile dysfunction)     HLD (hyperlipidemia)    HTN (hypertension)    IBS (irritable bowel syndrome)    Osteoarthritis     Past Surgical History:  Procedure Laterality Date   COLONOSCOPY WITH PROPOFOL N/A 01/06/2017   Procedure: COLONOSCOPY WITH PROPOFOL;  Surgeon: Lollie Sails, MD;  Location: [facility] ENDOSCOPY;  Service: Endoscopy;  Laterality: N/A;   nasal "growth"  2000   toenail removal     US ECHOCARDIOGRAPHY  06/2005   LV EF normal, Mild LVH, LAE    Family History  Problem Relation Age of Onset   Prostate cancer Father        prostate   Hyperlipidemia Father    Arrhythmia Father    Kidney failure Mother    Heart failure Mother    Gout Mother    Scoliosis Son     Social History   Socioeconomic History   Marital status: Widowed    Spouse name: Not on file   Number of children: 2   Years of education: Not on file   Highest education level: Not on file  Occupational History   Occupation: Retired    Hydrologist: Social research officer, government, Designer, 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[employer]  Tobacco Use   Smoking status: Never   Smokeless tobacco: Never  Vaping Use   Vaping Use: Never used  Substance and Sexual Activity   Alcohol use: Not Currently   Drug  use: No   Sexual activity: Not on file  Other Topics Concern   Not on file  Social History Narrative   Widowed 2021      Has living will   Son Marcus is his health care POA   Would accept resuscitation attempts   Might accept tube feeds based on situation   Social Determinants of Health   Financial Resource Strain: Not on file  Food Insecurity: Not on file  Transportation Needs: Not on file  Physical Activity: Not on file  Stress: Not on file  Social Connections: Not on file  Intimate Partner Violence: Not on file   Review of Systems No bowel incontinence No urine incontinence     Objective:   Physical Exam Constitutional:      Appearance: Normal appearance.  Neurological:     Mental Status: He is alert.           Assessment & Plan:

## 2021-12-05 NOTE — Assessment & Plan Note (Signed)
Exam shows abduction to 90 degrees Tenderness along bicipital tendon Fairly good internal and external rotation Discussed trying diclofenac gel on that Ice when painful Consider referral to Dr Mardelle Matte

## 2021-12-07 ENCOUNTER — Encounter: Payer: Self-pay | Admitting: Urology

## 2021-12-07 ENCOUNTER — Ambulatory Visit (INDEPENDENT_AMBULATORY_CARE_PROVIDER_SITE_OTHER): Payer: Medicare Other | Admitting: Urology

## 2021-12-07 ENCOUNTER — Other Ambulatory Visit: Payer: Self-pay

## 2021-12-07 VITALS — BP 139/82 | HR 72 | Ht 71.0 in | Wt 185.0 lb

## 2021-12-07 DIAGNOSIS — R319 Hematuria, unspecified: Secondary | ICD-10-CM | POA: Diagnosis not present

## 2021-12-07 LAB — URINALYSIS, COMPLETE
Bilirubin, UA: NEGATIVE
Glucose, UA: NEGATIVE
Ketones, UA: NEGATIVE
Leukocytes,UA: NEGATIVE
Nitrite, UA: NEGATIVE
Protein,UA: NEGATIVE
RBC, UA: NEGATIVE
Specific Gravity, UA: 1.02 (ref 1.005–1.030)
Urobilinogen, Ur: 0.2 mg/dL (ref 0.2–1.0)
pH, UA: 7.5 (ref 5.0–7.5)

## 2021-12-07 LAB — MICROSCOPIC EXAMINATION
Bacteria, UA: NONE SEEN
Epithelial Cells (non renal): NONE SEEN /hpf (ref 0–10)

## 2021-12-07 NOTE — Progress Notes (Signed)
12/07/2021 10:24 AM   [place] Dec [place] [birth year] [id number]  Referring provider: Eugenia Pancoast, [place],  [place] [address]  Chief Complaint  Patient presents with   Hematuria    HPI: Brendan Barnes is a 81 y.o. male referred for evaluation of hematuria.  Saw PCP 11/16/2021 after voiding small amount of visible blood associated with dysuria.  He has seen blood on 2 occasions but none recently. At the time of that visit he was also complaining of some increased frequency and urgency. Urinalysis at that visit did show 20-40 WBCs however urine culture was negative RUS was performed which showed no significant upper tract abnormalities. His voiding symptoms have resolved including dysuria and he is presently at baseline Previously followed by Dr. Jacqlyn Larsen for BPH and has been on finasteride for several years Those notes refer to an elevated PSA however PSAs between 2016 and 2019 were <1.  He has not had a previous biopsy and he most likely had a transient elevation that returned to baseline Last PSA in 2019 was 0.69   PMH: Past Medical History:  Diagnosis Date   BPH (benign prostatic hypertrophy)    Diabetes mellitus without complication (HCC)    ED (erectile dysfunction)    HLD (hyperlipidemia)    HTN (hypertension)    IBS (irritable bowel syndrome)    Osteoarthritis     Surgical History: Past Surgical History:  Procedure Laterality Date   COLONOSCOPY WITH PROPOFOL N/A 01/06/2017   Procedure: COLONOSCOPY WITH PROPOFOL;  Surgeon: Lollie Sails, MD;  [facility] ENDOSCOPY;  Service: Endoscopy;  Laterality: N/A;   nasal "growth"  2000   toenail removal     US ECHOCARDIOGRAPHY  06/2005   LV EF normal, Mild LVH, LAE    Home Medications:  Allergies as of 12/07/2021   Not on File      Medication List        Accurate as of December 07, 2021 10:24 AM. If you have any questions, ask your nurse or doctor.          amLODipine 5 MG  tablet Commonly known as: NORVASC TAKE 1 TABLET DAILY   finasteride 5 MG tablet Commonly known as: PROSCAR TAKE 1 TABLET DAILY   FREESTYLE LITE test strip Generic drug: glucose blood USE TO CHECK BLOOD SUGAR ONCE DAILY   gabapentin 300 MG capsule Commonly known as: NEURONTIN Take 1 capsule (300 mg total) by mouth 3 (three) times daily.   gabapentin 100 MG capsule Commonly known as: NEURONTIN Take by mouth.   lisinopril-hydrochlorothiazide 20-25 MG tablet Commonly known as: ZESTORETIC TAKE 1 TABLET DAILY   meloxicam 15 MG tablet Commonly known as: MOBIC TAKE 1 TABLET BY MOUTH DAILY AS NEEDED   metFORMIN 500 MG 24 hr tablet Commonly known as: GLUCOPHAGE-XR TAKE 2 TABLETS DAILY WITH BREAKFAST   multivitamin tablet Take 1 tablet by mouth daily.   polyethylene glycol 17 g packet Commonly known as: MIRALAX / GLYCOLAX Take 17 g by mouth 3 (three) times a week.   potassium chloride SA 20 MEQ tablet Commonly known as: KLOR-CON M TAKE 1 TABLET TWICE A DAY (NEED OFFICE VISIT)   simvastatin 20 MG tablet Commonly known as: ZOCOR TAKE 1 TABLET DAILY        Allergies: Not on File  Family History: Family History  Problem Relation Age of Onset   Prostate cancer Father        prostate   Hyperlipidemia Father  Arrhythmia Father    Kidney failure Mother    Heart failure Mother    Gout Mother    Scoliosis Son     Social History:  reports that he has never smoked. He has never used smokeless tobacco. He reports that he does not currently use alcohol. He reports that he does not use drugs.   Physical Exam: BP 139/82    Pulse 72    Ht 5\' 11"  (1.803 m)    Wt 185 lb (83.9 kg)    BMI 25.80 kg/m   Constitutional:  Alert and oriented, No acute distress. HEENT: [place] AT, moist mucus membranes.  Trachea midline, no masses. Cardiovascular: No clubbing, cyanosis, or edema. Respiratory: Normal respiratory effort, no increased work of breathing. Psychiatric: Normal mood and  affect.  Laboratory Data:  Urinalysis Dipstick/microscopy negative  Pertinent Imaging: Images were personally reviewed and interpreted  US RENAL  Narrative CLINICAL DATA:  hematuria/proteinuria, urinary frequency  EXAM: RENAL / URINARY TRACT ULTRASOUND COMPLETE  COMPARISON:  None.  FINDINGS: Right Kidney:  Renal measurements: 11.5 x 6.6 x 5.6 cm = volume: 224 mL. Echogenicity within normal limits. Couple simple appearing cystic lesions measuring up to 1.4 cm. No definite solid mass or hydronephrosis visualized.  Left Kidney:  Renal measurements: 12.1 x 5.5 x 5 cm = volume: 176 mL. Echogenicity within normal limits. A simple appearing likely cystic lesion measuring up to 1 cm. No definite solid mass or hydronephrosis visualized.  Urinary bladder:  Appears normal for degree of bladder distention.  Other:  The prostate measures 4.6 x 3.3 x 3.2 cm.  IMPRESSION: 1. Bilateral pericentimeter lesions likely representing simple renal cysts. 2. Otherwise unremarkable renal ultrasound.   Electronically Signed By: Iven Finn M.D. On: 12/06/2021 20:50   Assessment & Plan:    1.  Gross hematuria We discussed the recommended evaluation for gross hematuria which includes CT urogram and cystoscopy.  Renal ultrasound was normal and will schedule cystoscopy.  If no obvious findings on cystoscopy would recommend CT urogram  2.  Prostate cancer screening Current recommendations for prostate cancer screening are annual PSA/DRE between the ages of 101-69.  Last PSA in 2019 was 0.69 and would not recommend further PSA testing   Abbie Sons, MD  [facility] [address], [place], [place] [address] [phone]

## 2021-12-07 NOTE — Progress Notes (Signed)
Your kidney ultrasound was unremarkable for kidney stones.  There were just simple appearing cystic lesions on both kidneys which are typically not concerning.  Do have a follow-up appointment with your urologist?

## 2021-12-08 ENCOUNTER — Encounter: Payer: Self-pay | Admitting: Urology

## 2021-12-12 ENCOUNTER — Ambulatory Visit: Payer: Medicare Other | Admitting: Internal Medicine

## 2021-12-13 DIAGNOSIS — E1159 Type 2 diabetes mellitus with other circulatory complications: Secondary | ICD-10-CM | POA: Diagnosis not present

## 2021-12-13 DIAGNOSIS — B351 Tinea unguium: Secondary | ICD-10-CM | POA: Diagnosis not present

## 2021-12-17 DIAGNOSIS — M542 Cervicalgia: Secondary | ICD-10-CM | POA: Diagnosis not present

## 2021-12-17 DIAGNOSIS — M25511 Pain in right shoulder: Secondary | ICD-10-CM | POA: Diagnosis not present

## 2022-01-04 ENCOUNTER — Ambulatory Visit (INDEPENDENT_AMBULATORY_CARE_PROVIDER_SITE_OTHER): Payer: Medicare Other | Admitting: Urology

## 2022-01-04 ENCOUNTER — Encounter: Payer: Self-pay | Admitting: Urology

## 2022-01-04 ENCOUNTER — Other Ambulatory Visit: Payer: Self-pay

## 2022-01-04 VITALS — BP 130/79 | HR 74 | Ht 71.0 in | Wt 185.0 lb

## 2022-01-04 DIAGNOSIS — R319 Hematuria, unspecified: Secondary | ICD-10-CM | POA: Diagnosis not present

## 2022-01-04 LAB — MICROSCOPIC EXAMINATION
Bacteria, UA: NONE SEEN
Epithelial Cells (non renal): NONE SEEN /hpf (ref 0–10)
RBC, Urine: NONE SEEN /hpf (ref 0–2)
WBC, UA: NONE SEEN /hpf (ref 0–5)

## 2022-01-04 LAB — URINALYSIS, COMPLETE
Bilirubin, UA: NEGATIVE
Glucose, UA: NEGATIVE
Ketones, UA: NEGATIVE
Leukocytes,UA: NEGATIVE
Nitrite, UA: NEGATIVE
Protein,UA: NEGATIVE
RBC, UA: NEGATIVE
Specific Gravity, UA: 1.02 (ref 1.005–1.030)
Urobilinogen, Ur: 1 mg/dL (ref 0.2–1.0)
pH, UA: 7 (ref 5.0–7.5)

## 2022-01-04 NOTE — Progress Notes (Signed)
? ?  01/04/22 ? ?CC:  ?Chief Complaint  ?Patient presents with  ? Cysto  ? ? ?HPI: History gross hematuria.  Refer to office note 12/07/2021.  Denies recurrent gross hematuria ? ?Blood pressure 130/79, pulse 74, height 5\' 11"  (1.803 m), weight 185 lb (83.9 kg). ?NED. A&Ox3.   ?No respiratory distress   ?Abd soft, NT, ND ?Normal phallus with bilateral descended testicles ? ?Cystoscopy Procedure Note ? ?Patient identification was confirmed, informed consent was obtained, and patient was prepped using Betadine solution.  Lidocaine jelly was administered per urethral meatus.   ? ? ?Pre-Procedure: ?- Inspection reveals a normal caliber urethral meatus. ? ?Procedure: ?The flexible cystoscope was introduced without difficulty ?- No urethral strictures/lesions are present. ?-  Moderate lateral lobe enlargement  prostate with hypervascularity/friability ?- Normal bladder neck ?- Bilateral ureteral orifices identified ?- Bladder mucosa  reveals no ulcers, tumors, or lesions ?- No bladder stones ?- Mild trabeculation ? ?Retroflexion shows no intravesical median lobe or lesions ? ? ?Post-Procedure: ?- Patient tolerated the procedure well ? ?Assessment/ Plan: ?BPH with hypervascularity/friability which is the most likely source of his hematuria ?Urine cytology sent ?CTU for persistent hematuria ?He is on finasteride ? ? ?Abbie Sons, MD ? ?

## 2022-01-08 LAB — CYTOLOGY - NON PAP

## 2022-01-09 ENCOUNTER — Encounter: Payer: Self-pay | Admitting: *Deleted

## 2022-01-14 DIAGNOSIS — M25511 Pain in right shoulder: Secondary | ICD-10-CM | POA: Diagnosis not present

## 2022-01-14 DIAGNOSIS — M542 Cervicalgia: Secondary | ICD-10-CM | POA: Diagnosis not present

## 2022-01-15 DIAGNOSIS — L821 Other seborrheic keratosis: Secondary | ICD-10-CM | POA: Diagnosis not present

## 2022-01-15 DIAGNOSIS — L249 Irritant contact dermatitis, unspecified cause: Secondary | ICD-10-CM | POA: Diagnosis not present

## 2022-01-15 DIAGNOSIS — L820 Inflamed seborrheic keratosis: Secondary | ICD-10-CM | POA: Diagnosis not present

## 2022-01-15 DIAGNOSIS — L814 Other melanin hyperpigmentation: Secondary | ICD-10-CM | POA: Diagnosis not present

## 2022-01-15 DIAGNOSIS — L570 Actinic keratosis: Secondary | ICD-10-CM | POA: Diagnosis not present

## 2022-01-15 DIAGNOSIS — D492 Neoplasm of unspecified behavior of bone, soft tissue, and skin: Secondary | ICD-10-CM | POA: Diagnosis not present

## 2022-01-18 ENCOUNTER — Telehealth: Payer: Self-pay

## 2022-01-18 NOTE — Telephone Encounter (Signed)
[facility] is requesting last visit note for this patient regarding diabetic shoes. Please faxed [phone]

## 2022-01-21 NOTE — Telephone Encounter (Signed)
There are no OV Notes that discuss Diabetic shoes. Pt will need to be scheduled to see Dr Silvio Pate or wait until his next appt in 03-05-22. Spoke to pt. He said he already took care of the shoes. I advised him I had no notes to give her. He will contact Clarise Cruz. ?

## 2022-02-12 DIAGNOSIS — E1159 Type 2 diabetes mellitus with other circulatory complications: Secondary | ICD-10-CM | POA: Diagnosis not present

## 2022-02-12 DIAGNOSIS — B351 Tinea unguium: Secondary | ICD-10-CM | POA: Diagnosis not present

## 2022-02-18 DIAGNOSIS — L814 Other melanin hyperpigmentation: Secondary | ICD-10-CM | POA: Diagnosis not present

## 2022-02-18 DIAGNOSIS — L853 Xerosis cutis: Secondary | ICD-10-CM | POA: Diagnosis not present

## 2022-02-18 DIAGNOSIS — L821 Other seborrheic keratosis: Secondary | ICD-10-CM | POA: Diagnosis not present

## 2022-02-18 DIAGNOSIS — L210 Seborrhea capitis: Secondary | ICD-10-CM | POA: Diagnosis not present

## 2022-02-18 DIAGNOSIS — L570 Actinic keratosis: Secondary | ICD-10-CM | POA: Diagnosis not present

## 2022-03-05 ENCOUNTER — Encounter: Payer: Self-pay | Admitting: Internal Medicine

## 2022-03-05 ENCOUNTER — Ambulatory Visit (INDEPENDENT_AMBULATORY_CARE_PROVIDER_SITE_OTHER): Payer: Medicare Other | Admitting: Internal Medicine

## 2022-03-05 VITALS — BP 114/76 | HR 62 | Temp 97.6°F | Ht 71.0 in | Wt 181.0 lb

## 2022-03-05 DIAGNOSIS — M48061 Spinal stenosis, lumbar region without neurogenic claudication: Secondary | ICD-10-CM | POA: Diagnosis not present

## 2022-03-05 DIAGNOSIS — E1140 Type 2 diabetes mellitus with diabetic neuropathy, unspecified: Secondary | ICD-10-CM | POA: Diagnosis not present

## 2022-03-05 LAB — POCT GLYCOSYLATED HEMOGLOBIN (HGB A1C): Hemoglobin A1C: 5.8 % — AB (ref 4.0–5.6)

## 2022-03-05 NOTE — Assessment & Plan Note (Signed)
Doing better with AFO ?Pain mostly gone ?No surgery--still on gabapentin 400 at bedtime ?

## 2022-03-05 NOTE — Progress Notes (Signed)
? ?Subjective:  ? ? Patient ID: Brendan Barnes, male    DOB: 05/20/1941, 81 y.o.   MRN: [id number] ? ?HPI ?Here for follow up of diabetes and spinal stenosis ? ?Doing some better with the foot drop--now has AFO ?Diabetic shoes also ?Pain is just about gone---just in low back and not radiating down leg ?Still on gabapentin--- just '400mg'$  at bedtie ? ?No problems with sugars ?Checking daily lately---104-122 (due to some diarrhea and wondering about the metformin and tried without it) ? ?Current Outpatient Medications on File Prior to Visit  ?Medication Sig Dispense Refill  ? amLODipine (NORVASC) 5 MG tablet TAKE 1 TABLET DAILY 90 tablet 3  ? finasteride (PROSCAR) 5 MG tablet TAKE 1 TABLET DAILY 90 tablet 3  ? gabapentin (NEURONTIN) 100 MG capsule Take by mouth.    ? gabapentin (NEURONTIN) 300 MG capsule Take 1 capsule (300 mg total) by mouth 3 (three) times daily. 90 capsule 3  ? glucose blood (FREESTYLE LITE) test strip USE TO CHECK BLOOD SUGAR ONCE DAILY 100 strip 3  ? lisinopril-hydrochlorothiazide (ZESTORETIC) 20-25 MG tablet TAKE 1 TABLET DAILY 90 tablet 3  ? meloxicam (MOBIC) 15 MG tablet TAKE 1 TABLET BY MOUTH DAILY AS NEEDED 30 tablet 0  ? Multiple Vitamin (MULTIVITAMIN) tablet Take 1 tablet by mouth daily.    ? polyethylene glycol (MIRALAX / GLYCOLAX) packet Take 17 g by mouth 3 (three) times a week.    ? potassium chloride SA (KLOR-CON) 20 MEQ tablet TAKE 1 TABLET TWICE A DAY (NEED OFFICE VISIT) 180 tablet 3  ? simvastatin (ZOCOR) 20 MG tablet TAKE 1 TABLET DAILY 90 tablet 3  ? metFORMIN (GLUCOPHAGE-XR) 500 MG 24 hr tablet TAKE 2 TABLETS DAILY WITH BREAKFAST (Patient not taking: Reported on 03/05/2022) 180 tablet 3  ? ?No current facility-administered medications on file prior to visit.  ? ? ?No Known Allergies ? ?Past Medical History:  ?Diagnosis Date  ? BPH (benign prostatic hypertrophy)   ? Diabetes mellitus without complication ([place])   ? ED (erectile dysfunction)   ? HLD (hyperlipidemia)   ? HTN  (hypertension)   ? IBS (irritable bowel syndrome)   ? Osteoarthritis   ? ? ?Past Surgical History:  ?Procedure Laterality Date  ? COLONOSCOPY WITH PROPOFOL N/A 01/06/2017  ? Procedure: COLONOSCOPY WITH PROPOFOL;  Surgeon: Lollie Sails, MD;  Location: [facility]COPY;  Service: Endoscopy;  Laterality: N/A;  ? nasal "growth"  2000  ? toenail removal    ? US ECHOCARDIOGRAPHY  06/2005  ? LV EF normal, Mild LVH, LAE  ? ? ?Family History  ?Problem Relation Age of Onset  ? Prostate cancer Father   ?     prostate  ? Hyperlipidemia Father   ? Arrhythmia Father   ? Kidney failure Mother   ? Heart failure Mother   ? Gout Mother   ? Scoliosis Son   ? ? ?Social History  ? ?Socioeconomic History  ? Marital status: Widowed  ?  Spouse name: Not on file  ? Number of children: 2  ? Years of education: Not on file  ? Highest education level: Not on file  ?Occupational History  ? Occupation: Retired  ?  CommentChief Strategy Officer, [employer]  ?Tobacco Use  ? Smoking status: Never  ? Smokeless tobacco: Never  ?Vaping Use  ? Vaping Use: Never used  ?Substance and Sexual Activity  ? Alcohol use: Not Currently  ? Drug use: No  ? Sexual activity: Not on file  ?Other Topics  Concern  ? Not on file  ?Social History Narrative  ? Widowed 2021  ?   ? Has living will  ? Son Rik is his health care POA  ? Would accept resuscitation attempts  ? Might accept tube feeds based on situation  ? ?Social Determinants of Health  ? ?Financial Resource Strain: Not on file  ?Food Insecurity: Not on file  ?Transportation Needs: Not on file  ?Physical Activity: Not on file  ?Stress: Not on file  ?Social Connections: Not on file  ?Intimate Partner Violence: Not on file  ? ?Review of Systems ?Sleeps well with the gabapentin ?Some constipation with this---miralax helps ?   ?Objective:  ? Physical Exam ?Constitutional:   ?   Appearance: Normal appearance.  ?Cardiovascular:  ?   Rate and Rhythm: Normal rate and regular rhythm.  ?   Pulses: Normal pulses.  ?   Heart sounds:  No murmur heard. ?  No gallop.  ?Pulmonary:  ?   Effort: Pulmonary effort is normal.  ?   Breath sounds: Normal breath sounds. No wheezing or rales.  ?Musculoskeletal:  ?   Cervical back: Neck supple.  ?   Right lower leg: No edema.  ?   Left lower leg: No edema.  ?Lymphadenopathy:  ?   Cervical: No cervical adenopathy.  ?Skin: ?   Comments: No foot lesions  ?Neurological:  ?   Mental Status: He is alert.  ?Psychiatric:     ?   Mood and Affect: Mood normal.     ?   Behavior: Behavior normal.  ?  ? ? ? ? ?   ?Assessment & Plan:  ? ?

## 2022-03-05 NOTE — Assessment & Plan Note (Signed)
Lab Results  ?Component Value Date  ? HGBA1C 5.8 (A) 03/05/2022  ? ?Still good control off the metformin (diarrhea) ?He will monitor sugars---if above 130 regularly, he can try metformin 500 mg daily ? ?

## 2022-03-22 NOTE — Telephone Encounter (Signed)
Pt called and stated about his "Diabetic shoes". He also stated "he has not been billing for this yet. Would like to speak with the nurse when possible." Pt was last seen on 03/05/2022 for a f/u.  Callback Number: [phone]

## 2022-03-25 NOTE — Telephone Encounter (Signed)
Spoke to pt. He said he had received a call from someone about the diabetic shoes but was wanting to know where we sent the order. Looking in his chart, I found 2 orders, 1 in January for [facility]. Looked like an Therapist, sports for [employer] PT initiated that one. Then in April, we sent a signed order to [place] will check with [employer],

## 2022-05-28 DIAGNOSIS — B351 Tinea unguium: Secondary | ICD-10-CM | POA: Diagnosis not present

## 2022-05-28 DIAGNOSIS — E1159 Type 2 diabetes mellitus with other circulatory complications: Secondary | ICD-10-CM | POA: Diagnosis not present

## 2022-07-11 ENCOUNTER — Ambulatory Visit: Payer: Medicare Other | Admitting: Urology

## 2022-07-12 ENCOUNTER — Ambulatory Visit: Payer: Medicare Other | Admitting: Urology

## 2022-07-25 ENCOUNTER — Telehealth: Payer: Self-pay

## 2022-07-25 NOTE — Patient Outreach (Signed)
  Care Coordination   Initial Visit Note   07/25/2022 Name: Brendan Barnes MRN: [id number] DOB: [birth year]  Brendan Barnes is a 81 y.o. year old male who sees Venia Carbon, MD for primary care. I spoke with  Brendan Barnes by phone today.  What matters to the patients health and wellness today?  Patient states he received notification from a [facility] facility that he needed to have another colonoscopy.  Patient states he needs to know if another colonoscopy is necessary since his last one was in 2018.  Patient states he had visits with a neurosurgeon and orthopedic surgeon and was told he needed surgery on his back. Patient states he wants to know if he should do this.  Patient denies having any nursing or community resource needs at this time.    Goals Addressed             This Visit's Progress    Care coordination - no follow up needed       Care Coordination Interventions: Care coordination program/ services discussed and offered Social determinants of health survey completed Advised to contact office or send Mychart message to primary care provider to discuss questions regarding need for colonoscopy and back surgery Advised to contact primary care provider  or RNCM if care coordination services needed in the future.           SDOH assessments and interventions completed:  Yes  SDOH Interventions Today    Flowsheet Row Most Recent Value  SDOH Interventions   Food Insecurity Interventions Intervention Not Indicated  Housing Interventions Intervention Not Indicated  Transportation Interventions Intervention Not Indicated        Care Coordination Interventions Activated:  Yes  Care Coordination Interventions:  Yes, provided   Follow up plan: No further intervention required.   Encounter Outcome:  Pt. Visit Completed   Quinn Plowman RN,BSN,CCM [place] [phone] direct line

## 2022-08-22 DIAGNOSIS — J302 Other seasonal allergic rhinitis: Secondary | ICD-10-CM | POA: Diagnosis not present

## 2022-08-22 DIAGNOSIS — J300 Vasomotor rhinitis: Secondary | ICD-10-CM | POA: Diagnosis not present

## 2022-09-16 ENCOUNTER — Encounter: Payer: Self-pay | Admitting: Internal Medicine

## 2022-09-16 DIAGNOSIS — D1801 Hemangioma of skin and subcutaneous tissue: Secondary | ICD-10-CM | POA: Diagnosis not present

## 2022-09-16 DIAGNOSIS — L814 Other melanin hyperpigmentation: Secondary | ICD-10-CM | POA: Diagnosis not present

## 2022-09-16 DIAGNOSIS — L219 Seborrheic dermatitis, unspecified: Secondary | ICD-10-CM | POA: Diagnosis not present

## 2022-09-17 MED ORDER — AMLODIPINE BESYLATE 5 MG PO TABS: 5.0000 mg | ORAL_TABLET | Freq: Every day | ORAL | 3 refills | Status: DC

## 2022-09-20 ENCOUNTER — Encounter: Payer: Self-pay | Admitting: Internal Medicine

## 2022-09-20 ENCOUNTER — Ambulatory Visit (INDEPENDENT_AMBULATORY_CARE_PROVIDER_SITE_OTHER): Payer: Medicare Other | Admitting: Internal Medicine

## 2022-09-20 VITALS — BP 122/86 | HR 80 | Temp 98.0°F | Ht 70.0 in | Wt 194.0 lb

## 2022-09-20 DIAGNOSIS — I10 Essential (primary) hypertension: Secondary | ICD-10-CM | POA: Diagnosis not present

## 2022-09-20 DIAGNOSIS — E1140 Type 2 diabetes mellitus with diabetic neuropathy, unspecified: Secondary | ICD-10-CM | POA: Diagnosis not present

## 2022-09-20 DIAGNOSIS — N400 Enlarged prostate without lower urinary tract symptoms: Secondary | ICD-10-CM | POA: Diagnosis not present

## 2022-09-20 DIAGNOSIS — M21372 Foot drop, left foot: Secondary | ICD-10-CM

## 2022-09-20 DIAGNOSIS — Z0000 Encounter for general adult medical examination without abnormal findings: Secondary | ICD-10-CM

## 2022-09-20 DIAGNOSIS — E785 Hyperlipidemia, unspecified: Secondary | ICD-10-CM | POA: Diagnosis not present

## 2022-09-20 LAB — CBC
HCT: 43.3 % (ref 39.0–52.0)
Hemoglobin: 15.2 g/dL (ref 13.0–17.0)
MCHC: 35.2 g/dL (ref 30.0–36.0)
MCV: 87.1 fl (ref 78.0–100.0)
Platelets: 186 10*3/uL (ref 150.0–400.0)
RBC: 4.96 Mil/uL (ref 4.22–5.81)
RDW: 13.1 % (ref 11.5–15.5)
WBC: 6.9 10*3/uL (ref 4.0–10.5)

## 2022-09-20 LAB — HEPATIC FUNCTION PANEL
ALT: 15 U/L (ref 0–53)
AST: 17 U/L (ref 0–37)
Albumin: 4.3 g/dL (ref 3.5–5.2)
Alkaline Phosphatase: 50 U/L (ref 39–117)
Bilirubin, Direct: 0.3 mg/dL (ref 0.0–0.3)
Total Bilirubin: 2 mg/dL — ABNORMAL HIGH (ref 0.2–1.2)
Total Protein: 6.8 g/dL (ref 6.0–8.3)

## 2022-09-20 LAB — TSH: TSH: 1.85 u[IU]/mL (ref 0.35–5.50)

## 2022-09-20 LAB — RENAL FUNCTION PANEL
Albumin: 4.3 g/dL (ref 3.5–5.2)
BUN: 19 mg/dL (ref 6–23)
CO2: 32 mEq/L (ref 19–32)
Calcium: 9.8 mg/dL (ref 8.4–10.5)
Chloride: 97 mEq/L (ref 96–112)
Creatinine, Ser: 0.76 mg/dL (ref 0.40–1.50)
GFR: 84.29 mL/min (ref >60.00)
Glucose, Bld: 110 mg/dL — ABNORMAL HIGH (ref 70–99)
Phosphorus: 3.7 mg/dL (ref 2.3–4.6)
Potassium: 3.4 mEq/L — ABNORMAL LOW (ref 3.5–5.1)
Sodium: 137 mEq/L (ref 135–145)

## 2022-09-20 LAB — MICROALBUMIN / CREATININE URINE RATIO
Creatinine,U: 86.8 mg/dL
Microalb Creat Ratio: 0.8 mg/g (ref 0.0–30.0)
Microalb, Ur: <0.7 mg/dL (ref 0.0–1.9)

## 2022-09-20 LAB — LIPID PANEL
Cholesterol: 162 mg/dL (ref 0–200)
HDL: 47 mg/dL (ref >39.00)
LDL Cholesterol: 76 mg/dL (ref 0–99)
NonHDL: 114.51
Total CHOL/HDL Ratio: 3
Triglycerides: 195 mg/dL — ABNORMAL HIGH (ref 0.0–149.0)
VLDL: 39 mg/dL (ref 0.0–40.0)

## 2022-09-20 LAB — HEMOGLOBIN A1C: Hgb A1c MFr Bld: 6.4 % (ref 4.6–6.5)

## 2022-09-20 LAB — HM DIABETES FOOT EXAM

## 2022-09-20 MED ORDER — SIMVASTATIN 20 MG PO TABS: 20.0000 mg | ORAL_TABLET | Freq: Every day | ORAL | 3 refills | Status: DC

## 2022-09-20 MED ORDER — FINASTERIDE 5 MG PO TABS: 5.0000 mg | ORAL_TABLET | Freq: Every day | ORAL | 3 refills | Status: DC

## 2022-09-20 MED ORDER — GABAPENTIN 300 MG PO CAPS: 300.0000 mg | ORAL_CAPSULE | Freq: Every day | ORAL | 0 refills | Status: DC

## 2022-09-20 MED ORDER — POTASSIUM CHLORIDE CRYS ER 20 MEQ PO TBCR: EXTENDED_RELEASE_TABLET | ORAL | 3 refills | Status: DC

## 2022-09-20 MED ORDER — LISINOPRIL-HYDROCHLOROTHIAZIDE 20-25 MG PO TABS
1.0000 | ORAL_TABLET | Freq: Every day | ORAL | 3 refills | Status: DC
Start: 2022-09-20 — End: 2023-11-17

## 2022-09-20 NOTE — Progress Notes (Signed)
Vision Screening   Right eye Left eye Both eyes  Without correction     With correction '20/25 20/20 20/20 '$  Hearing Screening - Comments:: Passed whisper test

## 2022-09-20 NOTE — Assessment & Plan Note (Signed)
Seems to still have good control off the metformin (GI issues) Will check labs Consider SGLT2 inhibitor if up

## 2022-09-20 NOTE — Assessment & Plan Note (Signed)
No problems with simvastatin '20mg'$ 

## 2022-09-20 NOTE — Assessment & Plan Note (Signed)
I have personally reviewed the Medicare Annual Wellness questionnaire and have noted 1. The patient's medical and social history 2. Their use of alcohol, tobacco or illicit drugs 3. Their current medications and supplements 4. The patient's functional ability including ADL's, fall risks, home safety risks and hearing or visual             impairment. 5. Diet and physical activities 6. Evidence for depression or mood disorders  The patients weight, height, BMI and visual acuity have been recorded in the chart I have made referrals, counseling and provided education to the patient based review of the above and I have provided the pt with a written personalized care plan for preventive services.  I have provided you with a copy of your personalized plan for preventive services. Please take the time to review along with your updated medication list.  Done with cancer screening Discussed adding resistance work to his walking Had flu and updated COVID vaccines

## 2022-09-20 NOTE — Assessment & Plan Note (Signed)
Now has AFO

## 2022-09-20 NOTE — Assessment & Plan Note (Signed)
Voids okay with finasteride '5mg'$  daily

## 2022-09-20 NOTE — Assessment & Plan Note (Signed)
BP Readings from Last 3 Encounters:  09/20/22 122/86  03/05/22 114/76  01/04/22 130/79  'controlled with lisinopril/HCTZ-- 20/25 Will check labs

## 2022-09-20 NOTE — Progress Notes (Signed)
Subjective:    Patient ID: Brendan Barnes, male    DOB: 1941/06/02, 81 y.o.   MRN: [id number]  HPI Here for Medicare wellness visit and follow up of chronic health conditions Reviewed advanced directives Reviewed other doctors----Dr Curly Shores Pelletier--audiologist, Dr Jacelyn Grip, Dr Potter--neurology, [facility], Dr Morrison Old, Dr Jola Babinski, Dr Stoioff--urology No hospitalizations or surgery in the past year Vision fine Some hearing issues No alcohol or tobacco Not really exercising --but does walk regularly No falls since getting AFO--one before that No depression or anhedonia---some loneliness Independent with instrumental ADLs No memory issues  On meds for non allegic rhinitis from Dr Raelyn Number him out and it never clears the left side of his sinuses Hearing is not right ("like hearing in a drum") Will be going back to a different doctor there Still takes xyzal at night--helps him sleep  Has cut back on gabapentin Just 300 at bedtime PT for the foot drop---uses AFO  Checks sugars three times a week Usually 110-120 without meds (off metformin due to GI problems) No low sugar reactions  Voids okay on finasteride No nocturia  No chest pain No SOB No dizziness or syncope No palpitations  No edema--with compression hose No headaches  Current Outpatient Medications on File Prior to Visit  Medication Sig Dispense Refill   amLODipine (NORVASC) 5 MG tablet Take 1 tablet (5 mg total) by mouth daily. 90 tablet 3   fluticasone (FLONASE) 50 MCG/ACT nasal spray Place into both nostrils daily.     glucose blood (FREESTYLE LITE) test strip USE TO CHECK BLOOD SUGAR ONCE DAILY 100 strip 3   ipratropium (ATROVENT) 0.03 % nasal spray Place 2 sprays into both nostrils every 12 (twelve) hours.     levocetirizine (XYZAL) 5 MG tablet Take 5 mg by mouth daily as needed.     meloxicam (MOBIC) 15 MG tablet TAKE 1 TABLET BY MOUTH DAILY AS NEEDED 30  tablet 0   Multiple Vitamin (MULTIVITAMIN) tablet Take 1 tablet by mouth daily.     polyethylene glycol (MIRALAX / GLYCOLAX) packet Take 17 g by mouth 3 (three) times a week.     No current facility-administered medications on file prior to visit.    No Known Allergies  Past Medical History:  Diagnosis Date   BPH (benign prostatic hypertrophy)    Diabetes mellitus without complication ([facility])    ED (erectile dysfunction)    HLD (hyperlipidemia)    HTN (hypertension)    IBS (irritable bowel syndrome)    Osteoarthritis     Past Surgical History:  Procedure Laterality Date   COLONOSCOPY WITH PROPOFOL N/A 01/06/2017   Procedure: COLONOSCOPY WITH PROPOFOL;  Surgeon: Lollie Sails, MD;  Location: [facility] ENDOSCOPY;  Service: Endoscopy;  Laterality: N/A;   nasal "growth"  2000   toenail removal     US ECHOCARDIOGRAPHY  06/2005   LV EF normal, Mild LVH, LAE    Family History  Problem Relation Age of Onset   Prostate cancer Father        prostate   Hyperlipidemia Father    Arrhythmia Father    Kidney failure Mother    Heart failure Mother    Gout Mother    Scoliosis Son     Social History   Socioeconomic History   Marital status: Widowed    Spouse name: Not on file   Number of children: 2   Years of education: Not on file   Highest education level: Not on file  Occupational  History   Occupation: Retired    Hydrologist: Social research officer, government, 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  Tobacco Use   Smoking status: Never    Passive exposure: Never   Smokeless tobacco: Never  Vaping Use   Vaping Use: Never used  Substance and Sexual Activity   Alcohol use: Not Currently   Drug use: No   Sexual activity: Not on file  Other Topics Concern   Not on file  Social History Narrative   Widowed 2021      Has living will   Son Nehemias is his health care POA   Would accept resuscitation attempts   Might accept tube feeds based on situation   Social Determinants of Health   Financial Resource Strain: Not on file  Food  Insecurity: No Food Insecurity (07/25/2022)   Hunger Vital Sign    Worried About Running Out of Food in the Last Year: Never true    Ran Out of Food in the Last Year: Never true  Transportation Needs: No Transportation Needs (07/25/2022)   PRAPARE - Hydrologist (Medical): No    Lack of Transportation (Non-Medical): No  Physical Activity: Not on file  Stress: Not on file  Social Connections: Not on file  Intimate Partner Violence: Not on file   Review of Systems Appetite is good Weight stable from last year Sleeps okay Wears seat belt Teeth are fine---keeps up with dentist (recent crown) Occasional heartburn--gas X helps. Does have some mild dysphagia--later in the day Bowels are fine No sig back or joint pains No suspicious skin lesions Did hook right 5th toe on bed skirt---bruised up    Objective:   Physical Exam Constitutional:      Appearance: Normal appearance.  HENT:     Mouth/Throat:     Comments: No lesions Eyes:     Conjunctiva/sclera: Conjunctivae normal.     Pupils: Pupils are equal, round, and reactive to light.  Cardiovascular:     Rate and Rhythm: Normal rate and regular rhythm.     Heart sounds: No murmur heard.    No gallop.     Comments: Faint pedal pulses Pulmonary:     Effort: Pulmonary effort is normal.     Breath sounds: Normal breath sounds. No rales.  Abdominal:     Palpations: Abdomen is soft.     Tenderness: There is no abdominal tenderness.  Musculoskeletal:     Cervical back: Neck supple.     Right lower leg: No edema.     Left lower leg: No edema.     Comments: Calves are full  Lymphadenopathy:     Cervical: No cervical adenopathy.  Skin:    Findings: No lesion or rash.     Comments: No foot lesions  Neurological:     General: No focal deficit present.     Mental Status: He is alert and oriented to person, place, and time.     Comments: Mini-cog normal Left foot drop Decreased sensation in feet   Psychiatric:        Mood and Affect: Mood normal.        Behavior: Behavior normal.            Assessment & Plan:

## 2022-10-03 DIAGNOSIS — D2262 Melanocytic nevi of left upper limb, including shoulder: Secondary | ICD-10-CM | POA: Diagnosis not present

## 2022-10-03 DIAGNOSIS — L821 Other seborrheic keratosis: Secondary | ICD-10-CM | POA: Diagnosis not present

## 2022-10-03 DIAGNOSIS — D2272 Melanocytic nevi of left lower limb, including hip: Secondary | ICD-10-CM | POA: Diagnosis not present

## 2022-10-03 DIAGNOSIS — C4441 Basal cell carcinoma of skin of scalp and neck: Secondary | ICD-10-CM | POA: Diagnosis not present

## 2022-10-03 DIAGNOSIS — D2261 Melanocytic nevi of right upper limb, including shoulder: Secondary | ICD-10-CM | POA: Diagnosis not present

## 2022-10-03 DIAGNOSIS — D225 Melanocytic nevi of trunk: Secondary | ICD-10-CM | POA: Diagnosis not present

## 2022-10-03 DIAGNOSIS — L570 Actinic keratosis: Secondary | ICD-10-CM | POA: Diagnosis not present

## 2022-10-03 DIAGNOSIS — D485 Neoplasm of uncertain behavior of skin: Secondary | ICD-10-CM | POA: Diagnosis not present

## 2022-10-03 DIAGNOSIS — D2271 Melanocytic nevi of right lower limb, including hip: Secondary | ICD-10-CM | POA: Diagnosis not present

## 2022-10-10 DIAGNOSIS — E1159 Type 2 diabetes mellitus with other circulatory complications: Secondary | ICD-10-CM | POA: Diagnosis not present

## 2022-10-10 DIAGNOSIS — B351 Tinea unguium: Secondary | ICD-10-CM | POA: Diagnosis not present

## 2022-11-05 DIAGNOSIS — J302 Other seasonal allergic rhinitis: Secondary | ICD-10-CM | POA: Diagnosis not present

## 2022-11-05 DIAGNOSIS — J3489 Other specified disorders of nose and nasal sinuses: Secondary | ICD-10-CM | POA: Diagnosis not present

## 2022-11-05 DIAGNOSIS — J300 Vasomotor rhinitis: Secondary | ICD-10-CM | POA: Diagnosis not present

## 2022-11-19 DIAGNOSIS — J3489 Other specified disorders of nose and nasal sinuses: Secondary | ICD-10-CM | POA: Diagnosis not present

## 2022-11-25 DIAGNOSIS — J300 Vasomotor rhinitis: Secondary | ICD-10-CM | POA: Diagnosis not present

## 2022-11-25 DIAGNOSIS — J343 Hypertrophy of nasal turbinates: Secondary | ICD-10-CM | POA: Diagnosis not present

## 2022-11-25 DIAGNOSIS — J3489 Other specified disorders of nose and nasal sinuses: Secondary | ICD-10-CM | POA: Diagnosis not present

## 2022-11-25 DIAGNOSIS — J342 Deviated nasal septum: Secondary | ICD-10-CM | POA: Diagnosis not present

## 2022-11-25 DIAGNOSIS — J3089 Other allergic rhinitis: Secondary | ICD-10-CM | POA: Diagnosis not present

## 2022-11-28 DIAGNOSIS — C4441 Basal cell carcinoma of skin of scalp and neck: Secondary | ICD-10-CM | POA: Diagnosis not present

## 2022-11-28 DIAGNOSIS — D234 Other benign neoplasm of skin of scalp and neck: Secondary | ICD-10-CM | POA: Diagnosis not present

## 2022-12-18 DIAGNOSIS — E1159 Type 2 diabetes mellitus with other circulatory complications: Secondary | ICD-10-CM | POA: Diagnosis not present

## 2022-12-18 DIAGNOSIS — B351 Tinea unguium: Secondary | ICD-10-CM | POA: Diagnosis not present

## 2022-12-19 DIAGNOSIS — H40003 Preglaucoma, unspecified, bilateral: Secondary | ICD-10-CM | POA: Diagnosis not present

## 2022-12-19 DIAGNOSIS — E119 Type 2 diabetes mellitus without complications: Secondary | ICD-10-CM | POA: Diagnosis not present

## 2022-12-19 DIAGNOSIS — H2513 Age-related nuclear cataract, bilateral: Secondary | ICD-10-CM | POA: Diagnosis not present

## 2022-12-19 DIAGNOSIS — H02105 Unspecified ectropion of left lower eyelid: Secondary | ICD-10-CM | POA: Diagnosis not present

## 2022-12-19 LAB — HM DIABETES EYE EXAM

## 2023-01-05 IMAGING — MR MR LUMBAR SPINE W/O CM
4 of 5 series · 25 of 48 positions shown · non-contrast
Comparison: MRI lumbar spine 01/25/2020.

CLINICAL DATA: Low back pain with left leg numbness and left foot
drop. The patient is status post fall approximately 1 week ago.

EXAM:
MRI LUMBAR SPINE WITHOUT CONTRAST
TECHNIQUE: Multiplanar, multisequence MR imaging of the lumbar spine was
performed. No intravenous contrast was administered.

[Series 2: T2 · sagittal · 4.0mm · 0.57mm/px · 6 of 15 slices shown (1 of 2)]
[im 1/15]
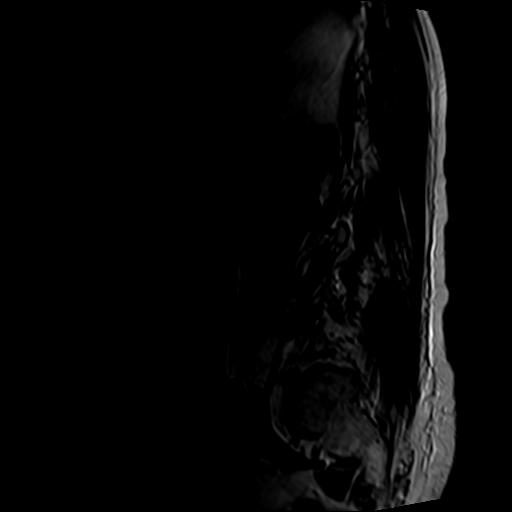
[im 3/15]
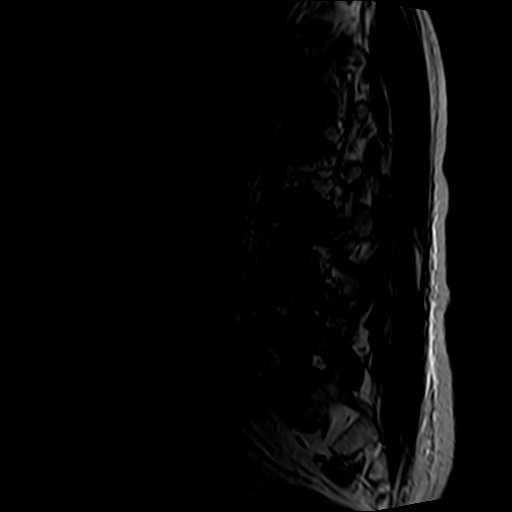
[im 6/15]
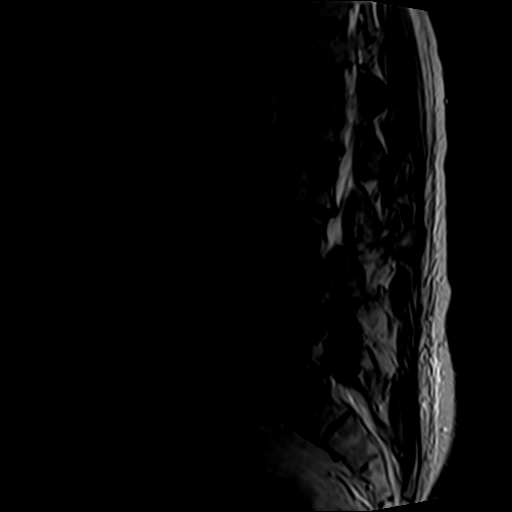
[im 9/15]
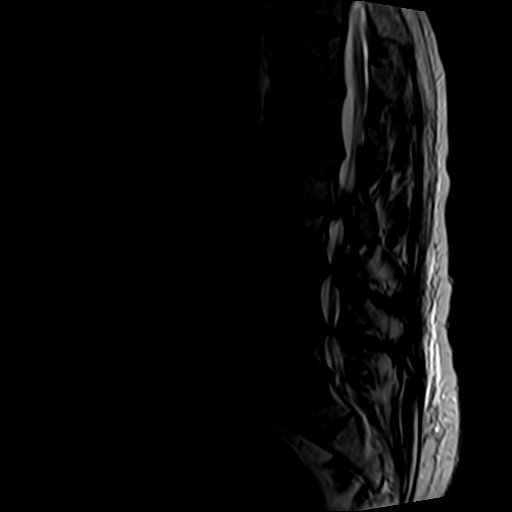
[im 12/15]
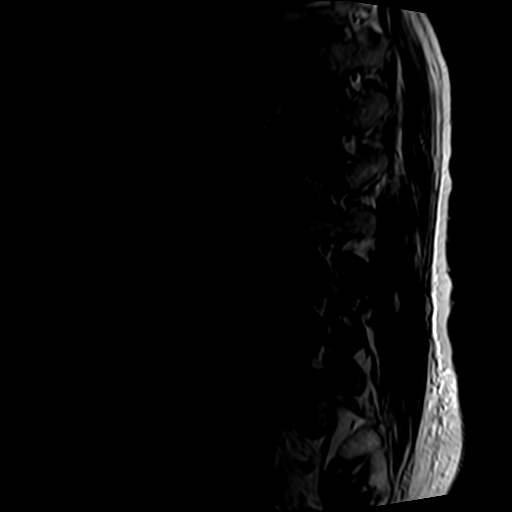
[im 15/15]
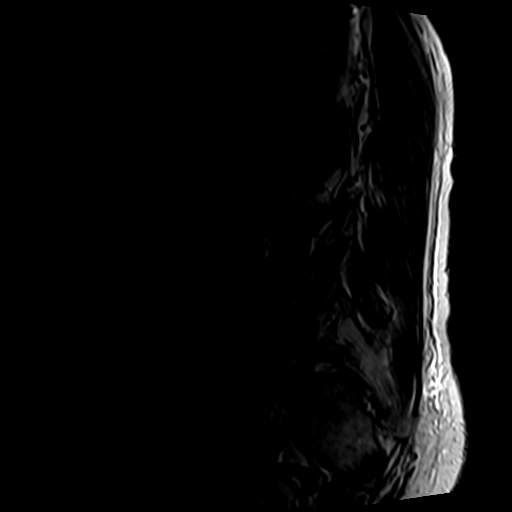

[Series 4: T1 · sagittal · 4.0mm · 0.57mm/px · 6 of 15 slices shown (1 of 2)]
[im 1/15]
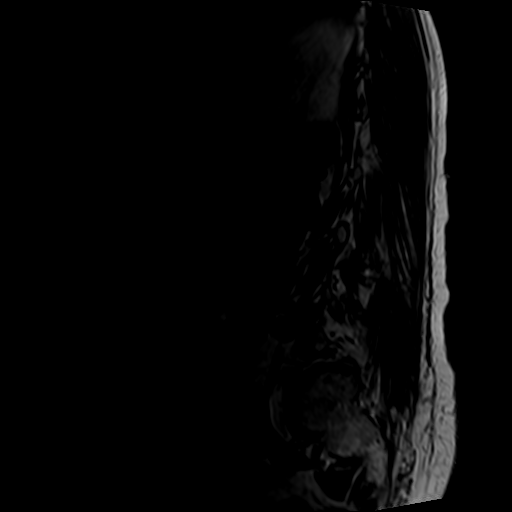
[im 3/15]
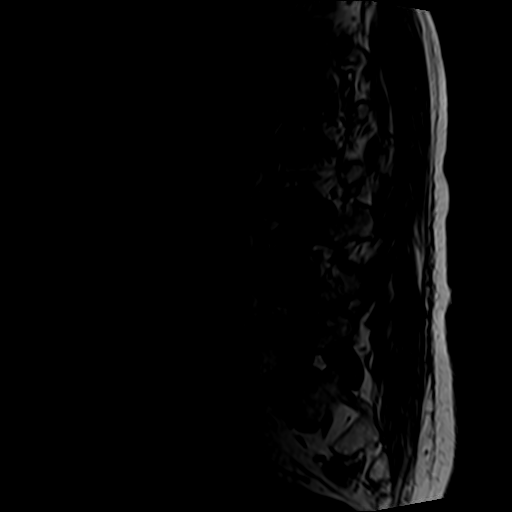
[im 6/15]
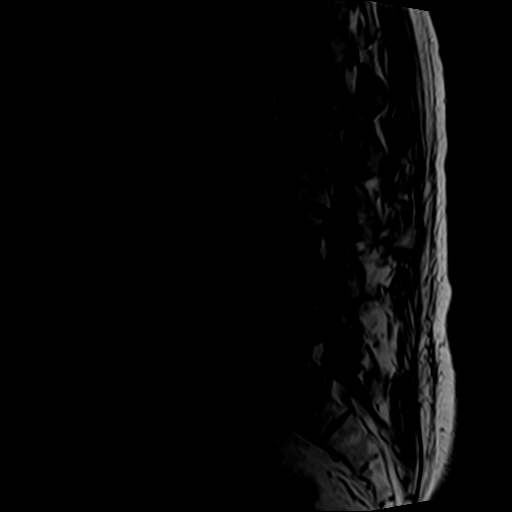
[im 9/15]
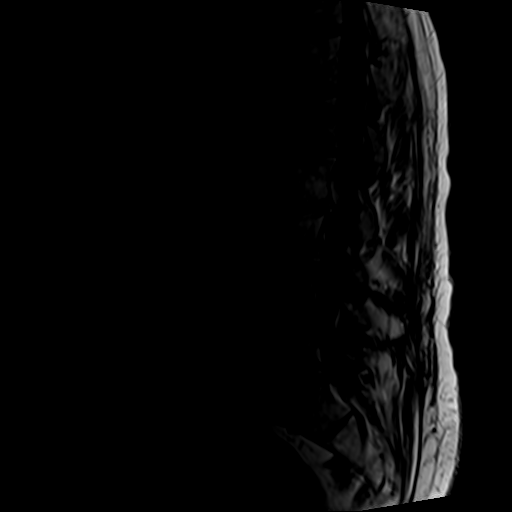
[im 12/15]
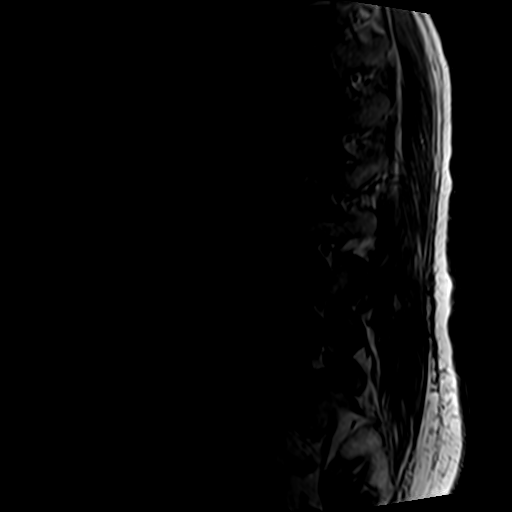
[im 15/15]
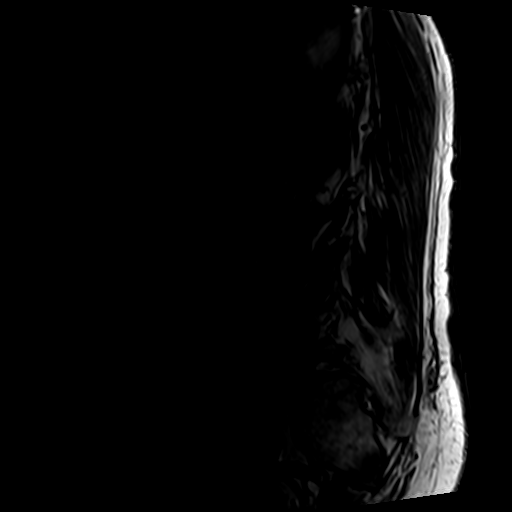

[Series 5: T2 · axial · 4.0mm · 0.70mm/px · z∈[-78,+140]mm · 9 of 37 slices shown (2 of 2)]
[im 1/37]
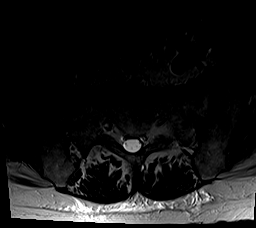
[im 6/37]
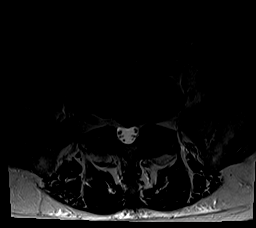
[im 11/37]
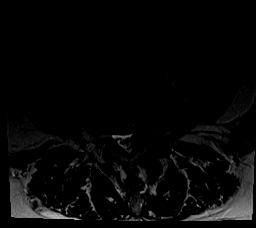
[im 16/37]
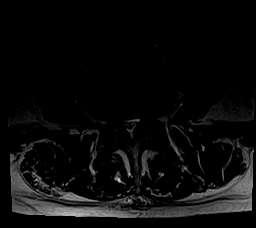
[im 19/37]
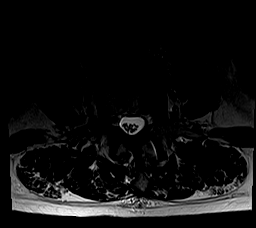
[im 21/37]
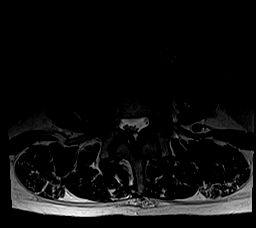
[im 26/37]
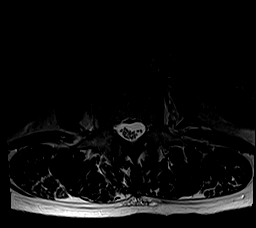
[im 31/37]
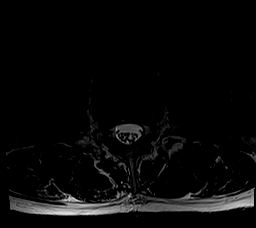
[im 37/37]
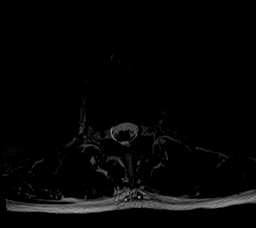

[Series 6: T1 · axial · 4.0mm · 0.35mm/px · z∈[-78,+109]mm · 4 of 37 slices shown (2 of 2)]
[im 1/37]
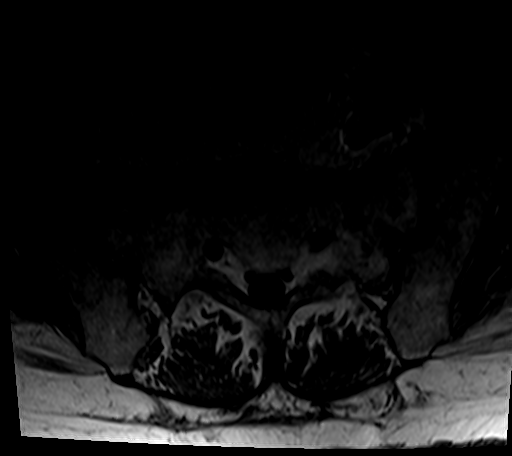
[im 6/37]
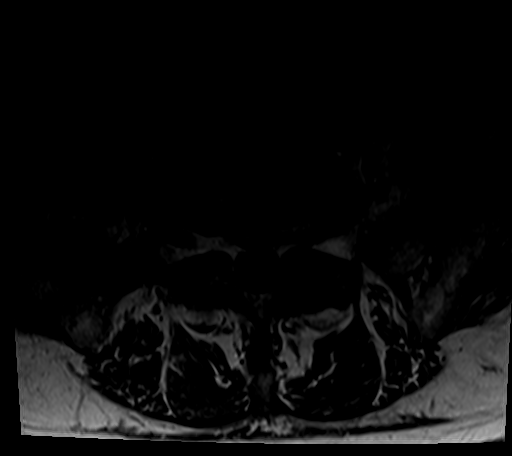
[im 19/37]
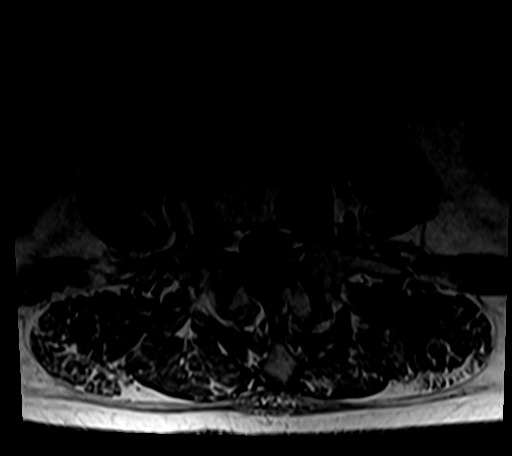
[im 31/37]
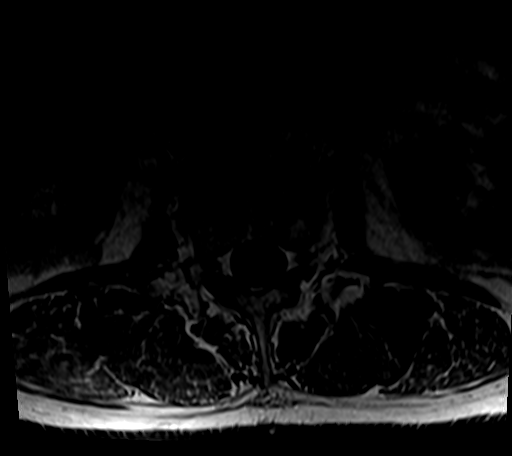

[25 of 48 positions shown; findings below may reference images not displayed]

FINDINGS: Segmentation:  Standard.

Alignment: Trace retrolisthesis L2 on L3 and straightening of
lordosis are unchanged.

Vertebrae: No fracture, evidence of discitis, or bone lesion.
Degenerative endplate signal change again seen at L2-3.

Conus medullaris and cauda equina: Conus extends to the L1 level.
Conus and cauda equina appear normal.

Paraspinal and other soft tissues: Negative.

Disc levels:

T11-12 is imaged in the sagittal plane only and negative.

T12-L1: Minimal right paracentral protrusion.  No stenosis.

L1-2: Shallow disc bulge, mild facet arthropathy and ligamentum
flavum thickening. Mild central canal stenosis. Foramina are open.
No change.

L2-3: Mild facet arthropathy and ligamentum flavum thickening. Loss
of disc space height with a shallow bulge and endplate spur, more
prominent to the right again seen. Moderate central canal stenosis
and narrowing in the subarticular recesses is seen. Mild bilateral
foraminal narrowing is more notable on the right. No change compared
to the prior exam.

L3-4: There has been progression of disease at this level. The
patient has a new, large down turning disc protrusion in the left
subarticular recess which impinges on the descending left L4 root.
The protrusion is superimposed on a bulge and ligamentum flavum
thickening causing moderate central canal stenosis. Mild bilateral
foraminal narrowing again seen.

L4-5: Ligamentum flavum thickening, mild to moderate facet
arthropathy and a diffuse broad-based disc bulge are again seen. The
patient's disc bulge is more prominent to the left and extends
beyond the foramen. Moderately severe to severe central canal
stenosis is again seen. Disc could irritate the exiting and exited
left L4 root. Mild right foraminal narrowing noted.

L5-S1: Shallow disc bulge more prominent to the right and mild
ligamentum flavum thickening again seen. Mild to moderate facet
arthropathy. Right worse than left subarticular recess narrowing is
seen. Neural foramina are open.
IMPRESSION: Dominant finding is a new large down turning disc protrusion in the
left subarticular recess at L3-4 impinging on the descending left L4
root. The protrusion is superimposed on a bulge and ligamentum
flavum thickening causing moderate central canal stenosis which
appears unchanged.

Moderately severe to severe central canal stenosis and left greater
than right narrowing in the subarticular recesses at L4-5. Disc
within and beyond the left foramen could impact the left L4 root.
The appearance is unchanged compared to the prior MRI

## 2023-02-11 ENCOUNTER — Telehealth: Payer: Self-pay | Admitting: Internal Medicine

## 2023-02-11 MED ORDER — FREESTYLE LITE TEST VI STRP: ORAL_STRIP | 3 refills | Status: DC

## 2023-02-11 NOTE — Telephone Encounter (Signed)
Prescription Request  02/11/2023  LOV: 09/20/2022  What is the name of the medication or equipment?  glucose blood (FREESTYLE LITE) test strip   Have you contacted your pharmacy to request a refill? No   Which pharmacy would you like this sent to?  [facility] - [place], [place] - [address] [place] [address] Phone: [phone] Fax: [phone]    Patient notified that their request is being sent to the clinical staff for review and that they should receive a response within 2 business days.   Please advise at [facility] [phone]

## 2023-03-03 DIAGNOSIS — J3089 Other allergic rhinitis: Secondary | ICD-10-CM | POA: Diagnosis not present

## 2023-03-03 DIAGNOSIS — J3489 Other specified disorders of nose and nasal sinuses: Secondary | ICD-10-CM | POA: Diagnosis not present

## 2023-03-03 DIAGNOSIS — J300 Vasomotor rhinitis: Secondary | ICD-10-CM | POA: Diagnosis not present

## 2023-03-03 DIAGNOSIS — J343 Hypertrophy of nasal turbinates: Secondary | ICD-10-CM | POA: Diagnosis not present

## 2023-03-18 DIAGNOSIS — B351 Tinea unguium: Secondary | ICD-10-CM | POA: Diagnosis not present

## 2023-03-18 DIAGNOSIS — E1159 Type 2 diabetes mellitus with other circulatory complications: Secondary | ICD-10-CM | POA: Diagnosis not present

## 2023-03-24 ENCOUNTER — Encounter: Payer: Self-pay | Admitting: Internal Medicine

## 2023-03-24 ENCOUNTER — Ambulatory Visit (INDEPENDENT_AMBULATORY_CARE_PROVIDER_SITE_OTHER): Payer: Medicare Other | Admitting: Internal Medicine

## 2023-03-24 VITALS — BP 118/76 | HR 60 | Temp 97.9°F | Ht 70.0 in | Wt 197.0 lb

## 2023-03-24 DIAGNOSIS — E1140 Type 2 diabetes mellitus with diabetic neuropathy, unspecified: Secondary | ICD-10-CM | POA: Diagnosis not present

## 2023-03-24 LAB — POCT GLYCOSYLATED HEMOGLOBIN (HGB A1C): Hemoglobin A1C: 6.7 % — AB (ref 4.0–5.6)

## 2023-03-24 MED ORDER — ACCU-CHEK SOFTCLIX LANCET DEV KIT: 1.0000 | PACK | Freq: Once | 0 refills | Status: AC

## 2023-03-24 MED ORDER — ACCU-CHEK GUIDE CONTROL VI LIQD: 1.0000 | Freq: Once | 2 refills | Status: DC

## 2023-03-24 MED ORDER — ACCU-CHEK GUIDE W/DEVICE KIT: 1.0000 | PACK | Freq: Once | 0 refills | Status: AC

## 2023-03-24 MED ORDER — ACCU-CHEK SOFTCLIX LANCETS MISC: 4 refills | Status: DC

## 2023-03-24 MED ORDER — ACCU-CHEK GUIDE VI STRP: ORAL_STRIP | 4 refills | Status: DC

## 2023-03-24 NOTE — Progress Notes (Signed)
Subjective:    Patient ID: Brendan Barnes, male    DOB: 08/10/1941, 82 y.o.   MRN: [id number]  HPI Here for follow up of diabetes  Doing fine Checks sugars daily---running some higher Normal was 105 fasting---and went up to 117 Re-tried the metformin, but still upset stomach/diarrhea Tried 500mg ---didn't seem to help sugars Off again for 1-2 months Now running 130-140 fasting Actually over 250 once--after sphagetti/garlic bread the night before  Uses the gabapentin 300mg  at bedtime Helps him sleep Feet stay "tight"--but not overly painful Uses AFO on left still  Current Outpatient Medications on File Prior to Visit  Medication Sig Dispense Refill   amLODipine (NORVASC) 5 MG tablet Take 1 tablet (5 mg total) by mouth daily. 90 tablet 3   finasteride (PROSCAR) 5 MG tablet Take 1 tablet (5 mg total) by mouth daily. 100 tablet 3   fluticasone (FLONASE) 50 MCG/ACT nasal spray Place into both nostrils daily.     gabapentin (NEURONTIN) 300 MG capsule Take 1 capsule (300 mg total) by mouth at bedtime. 1 capsule 0   ipratropium (ATROVENT) 0.03 % nasal spray Place 2 sprays into both nostrils every 12 (twelve) hours.     levocetirizine (XYZAL) 5 MG tablet Take 5 mg by mouth daily as needed.     lisinopril-hydrochlorothiazide (ZESTORETIC) 20-25 MG tablet Take 1 tablet by mouth daily. 100 tablet 3   meloxicam (MOBIC) 15 MG tablet TAKE 1 TABLET BY MOUTH DAILY AS NEEDED 30 tablet 0   Multiple Vitamin (MULTIVITAMIN) tablet Take 1 tablet by mouth daily.     polyethylene glycol (MIRALAX / GLYCOLAX) packet Take 17 g by mouth 3 (three) times a week.     potassium chloride SA (KLOR-CON M) 20 MEQ tablet TAKE 1 TABLET TWICE A DAY 200 tablet 3   simvastatin (ZOCOR) 20 MG tablet Take 1 tablet (20 mg total) by mouth daily. 100 tablet 3   No current facility-administered medications on file prior to visit.    No Known Allergies  Past Medical History:  Diagnosis Date   BPH (benign prostatic  hypertrophy)    Diabetes mellitus without complication (HCC)    ED (erectile dysfunction)    HLD (hyperlipidemia)    HTN (hypertension)    IBS (irritable bowel syndrome)    Osteoarthritis     Past Surgical History:  Procedure Laterality Date   COLONOSCOPY WITH PROPOFOL N/A 01/06/2017   Procedure: COLONOSCOPY WITH PROPOFOL;  Surgeon: Christena Deem, MD;  Location: [facility] ENDOSCOPY;  Service: Endoscopy;  Laterality: N/A;   nasal "growth"  2000   toenail removal     US ECHOCARDIOGRAPHY  06/2005   LV EF normal, Mild LVH, LAE    Family History  Problem Relation Age of Onset   Prostate cancer Father        prostate   Hyperlipidemia Father    Arrhythmia Father    Kidney failure Mother    Heart failure Mother    Gout Mother    Scoliosis Son     Social History   Socioeconomic History   Marital status: Widowed    Spouse name: Not on file   Number of children: 2   Years of education: Not on file   Highest education level: Bachelor's degree (e.g., BA, AB, BS)  Occupational History   Occupation: Retired    Veterinary surgeon: Company secretary, 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[employer]  Tobacco Use   Smoking status: Never    Passive exposure: Never   Smokeless tobacco: Never  Vaping Use  Vaping Use: Never used  Substance and Sexual Activity   Alcohol use: Not Currently   Drug use: No   Sexual activity: Not on file  Other Topics Concern   Not on file  Social History Narrative   Widowed 2021      Has living will   Son Demarrio is his health care POA   Would accept resuscitation attempts   Might accept tube feeds based on situation   Social Determinants of Health   Financial Resource Strain: Low Risk  (03/20/2023)   Overall Financial Resource Strain (CARDIA)    Difficulty of Paying Living Expenses: Not hard at all  Food Insecurity: No Food Insecurity (03/20/2023)   Hunger Vital Sign    Worried About Running Out of Food in the Last Year: Never true    Ran Out of Food in the Last Year: Never true  Transportation Needs:  No Transportation Needs (03/20/2023)   PRAPARE - Administrator, Civil Service (Medical): No    Lack of Transportation (Non-Medical): No  Physical Activity: Unknown (03/20/2023)   Exercise Vital Sign    Days of Exercise per Week: 0 days    Minutes of Exercise per Session: Not on file  Stress: No Stress Concern Present (03/20/2023)   [employer] of Occupational Health - Occupational Stress Questionnaire    Feeling of Stress : Not at all  Social Connections: Moderately Isolated (03/20/2023)   Social Connection and Isolation Panel [NHANES]    Frequency of Communication with Friends and Family: More than three times a week    Frequency of Social Gatherings with Friends and Family: More than three times a week    Attends Religious Services: Never    Database administrator or Organizations: Yes    Attends Engineer, structural: More than 4 times per year    Marital Status: Widowed  Catering manager Violence: Not on file   Review of Systems No chest pain No SOB     Objective:   Physical Exam Constitutional:      Appearance: Normal appearance.  Cardiovascular:     Rate and Rhythm: Normal rate and regular rhythm.     Heart sounds: No murmur heard.    No gallop.     Comments: Faint pulse left foot--absent on right Pulmonary:     Effort: Pulmonary effort is normal.     Breath sounds: Normal breath sounds. No wheezing or rales.  Musculoskeletal:     Cervical back: Neck supple.     Comments: Trace ankle edema---uses hose  Lymphadenopathy:     Cervical: No cervical adenopathy.  Neurological:     Mental Status: He is alert.            Assessment & Plan:

## 2023-03-24 NOTE — Assessment & Plan Note (Addendum)
Lab Results  Component Value Date   HGBA1C 6.7 (A) 03/24/2023   Despite higher sugars, his overall control is still acceptable without meds Clearly he didn't tolerate metformin again--should not use it  If his sugars go up, would consider jardiance 10/farxiga 5  Still on the nightly gabapentin 300mg  daily  Will order new strips/glucometer

## 2023-04-08 DIAGNOSIS — M2041 Other hammer toe(s) (acquired), right foot: Secondary | ICD-10-CM | POA: Diagnosis not present

## 2023-04-08 DIAGNOSIS — E1140 Type 2 diabetes mellitus with diabetic neuropathy, unspecified: Secondary | ICD-10-CM | POA: Diagnosis not present

## 2023-04-08 DIAGNOSIS — M2042 Other hammer toe(s) (acquired), left foot: Secondary | ICD-10-CM | POA: Diagnosis not present

## 2023-04-08 DIAGNOSIS — M21372 Foot drop, left foot: Secondary | ICD-10-CM | POA: Diagnosis not present

## 2023-04-17 ENCOUNTER — Other Ambulatory Visit: Payer: Self-pay | Admitting: Internal Medicine

## 2023-04-17 NOTE — Telephone Encounter (Signed)
Please let him know I did refill it--but only for once in a while use (like if tylenol was not effective)

## 2023-04-29 ENCOUNTER — Telehealth: Payer: Self-pay | Admitting: Internal Medicine

## 2023-04-29 MED ORDER — GABAPENTIN 100 MG PO CAPS: 100.0000 mg | ORAL_CAPSULE | Freq: Every day | ORAL | 3 refills | Status: DC

## 2023-04-29 NOTE — Telephone Encounter (Signed)
Called and spoke to pt. He is on gabapentin 300 mg at bedtime. Wants to start decreasing the amount. Would like a rx for 100 mg so he can gradually go from 200mg  to 100mg . Wants a 90 day supply sent to [facility].

## 2023-04-29 NOTE — Telephone Encounter (Signed)
Patient contacted the office requesting to speak with Brendan Barnes, advised patient she may be with a patient at the moment but I could leave a message. Patient agreed and asked for a call back whenever possible, please advise

## 2023-04-29 NOTE — Telephone Encounter (Signed)
Rx changed as requested

## 2023-04-30 DIAGNOSIS — G629 Polyneuropathy, unspecified: Secondary | ICD-10-CM | POA: Diagnosis not present

## 2023-04-30 DIAGNOSIS — R2689 Other abnormalities of gait and mobility: Secondary | ICD-10-CM | POA: Diagnosis not present

## 2023-04-30 DIAGNOSIS — M21372 Foot drop, left foot: Secondary | ICD-10-CM | POA: Diagnosis not present

## 2023-05-14 DIAGNOSIS — J343 Hypertrophy of nasal turbinates: Secondary | ICD-10-CM | POA: Diagnosis not present

## 2023-05-14 DIAGNOSIS — J300 Vasomotor rhinitis: Secondary | ICD-10-CM | POA: Diagnosis not present

## 2023-05-26 DIAGNOSIS — J300 Vasomotor rhinitis: Secondary | ICD-10-CM | POA: Diagnosis not present

## 2023-06-19 DIAGNOSIS — Z872 Personal history of diseases of the skin and subcutaneous tissue: Secondary | ICD-10-CM | POA: Diagnosis not present

## 2023-06-19 DIAGNOSIS — D225 Melanocytic nevi of trunk: Secondary | ICD-10-CM | POA: Diagnosis not present

## 2023-06-19 DIAGNOSIS — S90112A Contusion of left great toe without damage to nail, initial encounter: Secondary | ICD-10-CM | POA: Diagnosis not present

## 2023-06-19 DIAGNOSIS — D2272 Melanocytic nevi of left lower limb, including hip: Secondary | ICD-10-CM | POA: Diagnosis not present

## 2023-06-19 DIAGNOSIS — L821 Other seborrheic keratosis: Secondary | ICD-10-CM | POA: Diagnosis not present

## 2023-06-19 DIAGNOSIS — Z85828 Personal history of other malignant neoplasm of skin: Secondary | ICD-10-CM | POA: Diagnosis not present

## 2023-06-19 DIAGNOSIS — D2271 Melanocytic nevi of right lower limb, including hip: Secondary | ICD-10-CM | POA: Diagnosis not present

## 2023-06-19 DIAGNOSIS — D2262 Melanocytic nevi of left upper limb, including shoulder: Secondary | ICD-10-CM | POA: Diagnosis not present

## 2023-06-19 DIAGNOSIS — D2261 Melanocytic nevi of right upper limb, including shoulder: Secondary | ICD-10-CM | POA: Diagnosis not present

## 2023-06-23 DIAGNOSIS — J300 Vasomotor rhinitis: Secondary | ICD-10-CM | POA: Diagnosis not present

## 2023-06-23 DIAGNOSIS — G4730 Sleep apnea, unspecified: Secondary | ICD-10-CM | POA: Diagnosis not present

## 2023-07-02 DIAGNOSIS — G4733 Obstructive sleep apnea (adult) (pediatric): Secondary | ICD-10-CM | POA: Diagnosis not present

## 2023-07-22 ENCOUNTER — Telehealth: Payer: Self-pay | Admitting: Internal Medicine

## 2023-07-22 DIAGNOSIS — Z86010 Personal history of colonic polyps: Secondary | ICD-10-CM | POA: Diagnosis not present

## 2023-07-22 DIAGNOSIS — Z09 Encounter for follow-up examination after completed treatment for conditions other than malignant neoplasm: Secondary | ICD-10-CM | POA: Diagnosis not present

## 2023-07-22 NOTE — Telephone Encounter (Signed)
Faxed November 2023 OV Note

## 2023-07-22 NOTE — Telephone Encounter (Signed)
[facility] called asking if there was a recent foot exam Dr. Alphonsus Sias had for the pt? If so, can it be faxed over? Fax # is [phone], call back # [phone]

## 2023-07-29 DIAGNOSIS — G4733 Obstructive sleep apnea (adult) (pediatric): Secondary | ICD-10-CM | POA: Diagnosis not present

## 2023-07-29 DIAGNOSIS — J310 Chronic rhinitis: Secondary | ICD-10-CM | POA: Diagnosis not present

## 2023-08-26 DIAGNOSIS — Z23 Encounter for immunization: Secondary | ICD-10-CM | POA: Diagnosis not present

## 2023-09-23 DIAGNOSIS — G4733 Obstructive sleep apnea (adult) (pediatric): Secondary | ICD-10-CM | POA: Diagnosis not present

## 2023-09-29 ENCOUNTER — Encounter: Payer: Self-pay | Admitting: Internal Medicine

## 2023-09-29 ENCOUNTER — Ambulatory Visit (INDEPENDENT_AMBULATORY_CARE_PROVIDER_SITE_OTHER): Payer: Medicare Other | Admitting: Internal Medicine

## 2023-09-29 VITALS — BP 124/80 | HR 93 | Temp 98.1°F | Ht 70.0 in | Wt 194.0 lb

## 2023-09-29 DIAGNOSIS — Z0000 Encounter for general adult medical examination without abnormal findings: Secondary | ICD-10-CM | POA: Diagnosis not present

## 2023-09-29 DIAGNOSIS — Z7984 Long term (current) use of oral hypoglycemic drugs: Secondary | ICD-10-CM

## 2023-09-29 DIAGNOSIS — I10 Essential (primary) hypertension: Secondary | ICD-10-CM | POA: Diagnosis not present

## 2023-09-29 DIAGNOSIS — N400 Enlarged prostate without lower urinary tract symptoms: Secondary | ICD-10-CM | POA: Diagnosis not present

## 2023-09-29 DIAGNOSIS — M21372 Foot drop, left foot: Secondary | ICD-10-CM

## 2023-09-29 DIAGNOSIS — E1140 Type 2 diabetes mellitus with diabetic neuropathy, unspecified: Secondary | ICD-10-CM | POA: Diagnosis not present

## 2023-09-29 DIAGNOSIS — I872 Venous insufficiency (chronic) (peripheral): Secondary | ICD-10-CM

## 2023-09-29 LAB — COMPREHENSIVE METABOLIC PANEL
ALT: 11 U/L (ref 0–53)
AST: 14 U/L (ref 0–37)
Albumin: 4.2 g/dL (ref 3.5–5.2)
Alkaline Phosphatase: 51 U/L (ref 39–117)
BUN: 14 mg/dL (ref 6–23)
CO2: 33 meq/L — ABNORMAL HIGH (ref 19–32)
Calcium: 9.7 mg/dL (ref 8.4–10.5)
Chloride: 95 meq/L — ABNORMAL LOW (ref 96–112)
Creatinine, Ser: 0.8 mg/dL (ref 0.40–1.50)
GFR: 82.4 mL/min (ref >60.00)
Glucose, Bld: 132 mg/dL — ABNORMAL HIGH (ref 70–99)
Potassium: 3.4 meq/L — ABNORMAL LOW (ref 3.5–5.1)
Sodium: 136 meq/L (ref 135–145)
Total Bilirubin: 2.1 mg/dL — ABNORMAL HIGH (ref 0.2–1.2)
Total Protein: 6.7 g/dL (ref 6.0–8.3)

## 2023-09-29 LAB — CBC
HCT: 43.4 % (ref 39.0–52.0)
Hemoglobin: 14.9 g/dL (ref 13.0–17.0)
MCHC: 34.3 g/dL (ref 30.0–36.0)
MCV: 88 fl (ref 78.0–100.0)
Platelets: 196 K/uL (ref 150.0–400.0)
RBC: 4.93 Mil/uL (ref 4.22–5.81)
RDW: 13.1 % (ref 11.5–15.5)
WBC: 7.4 K/uL (ref 4.0–10.5)

## 2023-09-29 LAB — LIPID PANEL
Cholesterol: 162 mg/dL (ref 0–200)
HDL: 43.2 mg/dL
LDL Cholesterol: 86 mg/dL (ref 0–99)
NonHDL: 119.09
Total CHOL/HDL Ratio: 4
Triglycerides: 164 mg/dL — ABNORMAL HIGH (ref 0.0–149.0)
VLDL: 32.8 mg/dL (ref 0.0–40.0)

## 2023-09-29 LAB — TSH: TSH: 1.51 u[IU]/mL (ref 0.35–5.50)

## 2023-09-29 LAB — HM DIABETES FOOT EXAM

## 2023-09-29 LAB — HEMOGLOBIN A1C: Hgb A1c MFr Bld: 7.2 % — ABNORMAL HIGH (ref 4.6–6.5)

## 2023-09-29 MED ORDER — AMLODIPINE BESYLATE 5 MG PO TABS: 5.0000 mg | ORAL_TABLET | Freq: Every day | ORAL | 3 refills | Status: DC

## 2023-09-29 NOTE — Progress Notes (Signed)
Hearing Screening - Comments:: Tested within the last 12 months by [facility] Screening - Comments:: February 2024

## 2023-09-29 NOTE — Assessment & Plan Note (Signed)
Does okay with daily support socks

## 2023-09-29 NOTE — Assessment & Plan Note (Signed)
Uses the AFO

## 2023-09-29 NOTE — Assessment & Plan Note (Signed)
Seems to still have reasonable control No meds for this now--had GI problems with metformin Using gabapentin 150mg  at bedtime Will fariga/jardiance if A1c elevated

## 2023-09-29 NOTE — Assessment & Plan Note (Signed)
Voids okay on finasteride 5mg  daily

## 2023-09-29 NOTE — Assessment & Plan Note (Signed)
BP Readings from Last 3 Encounters:  09/29/23 124/80  03/24/23 118/76  09/20/22 122/86   Controlled on lisinopiril/hydrochlorothiazide 20/25 and amlodipine 5mg  daily

## 2023-09-29 NOTE — Assessment & Plan Note (Signed)
I have personally reviewed the Medicare Annual Wellness questionnaire and have noted 1. The patient's medical and social history 2. Their use of alcohol, tobacco or illicit drugs 3. Their current medications and supplements 4. The patient's functional ability including ADL's, fall risks, home safety risks and hearing or visual             impairment. 5. Diet and physical activities 6. Evidence for depression or mood disorders  The patients weight, height, BMI and visual acuity have been recorded in the chart I have made referrals, counseling and provided education to the patient based review of the above and I have provided the pt with a written personalized care plan for preventive services.  I have provided you with a copy of your personalized plan for preventive services. Please take the time to review along with your updated medication list.  Done with cancer screening UTD on imms Discussed doing a better job with exercise

## 2023-09-29 NOTE — Progress Notes (Signed)
Subjective:    Patient ID: Brendan Barnes, male    DOB: 02-28-1941, 82 y.o.   MRN: [id number]  HPI Here for Medicare wellness visit and follow up of chronic health conditions Reviewed advanced directives Reviewed other doctors---Dr Cyd Silence, Dr Moshe Cipro, Dr Potter--neurology,Dr Barbera Setters, Dr Concepcion Elk, Dr Stoioff--urology, Dr Thomasene Lot No surgery or hospitalizations or surgery in the past year Does a little walking--no resistance No alcohol or tobacco Vision is fine Hearing is okay No falls No depression or anhedonia Independent with instrumental ADLs No memory issues  Troubled with trigger finger--right 3rd Will stick then opens Plans to go to [facility] if it worsens  Was diagnosed with OSA--but Dr Elenore Rota Trying to decide about mandibular device or CPAP Not really having daytime sleepiness though Had nerve ablation inside nose to reduce drainage--didn't work Using ipratropium nasal spray--does help some Feels the gabapentin might be drying his mouth at night (1/2 of 300mg  dose)--helps his leg pain  Does check sugars three times a week--usually 140's fasting High today---193  No chest pain or SOB No dizziness or syncope No palpitations No sig edema--does wear compression socks daily (and AFO on left)  Current Outpatient Medications on File Prior to Visit  Medication Sig Dispense Refill   Accu-Chek Softclix Lancets lancets Use to obtain blood sugar once sample daily 100 each 4   amLODipine (NORVASC) 5 MG tablet Take 1 tablet (5 mg total) by mouth daily. 90 tablet 3   finasteride (PROSCAR) 5 MG tablet Take 1 tablet (5 mg total) by mouth daily. 100 tablet 3   gabapentin (NEURONTIN) 100 MG capsule Take 1-2 capsules (100-200 mg total) by mouth at bedtime. 180 capsule 3   glucose blood (ACCU-CHEK GUIDE) test strip Use to check blood sugar once daily 100 each 4   ipratropium (ATROVENT) 0.03 % nasal spray Place 2 sprays into both nostrils  every 12 (twelve) hours.     levocetirizine (XYZAL) 5 MG tablet Take 5 mg by mouth daily as needed.     lisinopril-hydrochlorothiazide (ZESTORETIC) 20-25 MG tablet Take 1 tablet by mouth daily. 100 tablet 3   meloxicam (MOBIC) 15 MG tablet TAKE 1 TABLET BY MOUTH DAILY AS NEEDED 30 tablet 0   Multiple Vitamin (MULTIVITAMIN) tablet Take 1 tablet by mouth daily.     polyethylene glycol (MIRALAX / GLYCOLAX) packet Take 17 g by mouth 3 (three) times a week.     potassium chloride SA (KLOR-CON M) 20 MEQ tablet TAKE 1 TABLET TWICE A DAY 200 tablet 3   simvastatin (ZOCOR) 20 MG tablet Take 1 tablet (20 mg total) by mouth daily. 100 tablet 3   Blood Glucose Calibration (ACCU-CHEK GUIDE CONTROL) LIQD 1 each by In Vitro route once for 1 dose. 1 each 2   No current facility-administered medications on file prior to visit.    No Known Allergies  Past Medical History:  Diagnosis Date   BPH (benign prostatic hypertrophy)    Diabetes mellitus without complication (HCC)    ED (erectile dysfunction)    HLD (hyperlipidemia)    HTN (hypertension)    IBS (irritable bowel syndrome)    Osteoarthritis     Past Surgical History:  Procedure Laterality Date   COLONOSCOPY WITH PROPOFOL N/A 01/06/2017   Procedure: COLONOSCOPY WITH PROPOFOL;  Surgeon: Christena Deem, MD;  Location: [facility] ENDOSCOPY;  Service: Endoscopy;  Laterality: N/A;   nasal "growth"  2000   toenail removal     US ECHOCARDIOGRAPHY  06/2005   LV EF normal,  Mild LVH, LAE    Family History  Problem Relation Age of Onset   Prostate cancer Father        prostate   Hyperlipidemia Father    Arrhythmia Father    Kidney failure Mother    Heart failure Mother    Gout Mother    Scoliosis Son     Social History   Socioeconomic History   Marital status: Widowed    Spouse name: Not on file   Number of children: 2   Years of education: Not on file   Highest education level: Bachelor's degree (e.g., BA, AB, BS)  Occupational History    Occupation: Retired    Veterinary surgeon: Company secretary, Animator[employer]  Tobacco Use   Smoking status: Never    Passive exposure: Never   Smokeless tobacco: Never  Vaping Use   Vaping status: Never Used  Substance and Sexual Activity   Alcohol use: Not Currently   Drug use: No   Sexual activity: Not on file  Other Topics Concern   Not on file  Social History Narrative   Widowed 2021      Has living will   Son Jassen is his health care POA   Would accept resuscitation attempts   Might accept tube feeds based on situation   Social Determinants of Health   Financial Resource Strain: Low Risk  (03/20/2023)   Overall Financial Resource Strain (CARDIA)    Difficulty of Paying Living Expenses: Not hard at all  Food Insecurity: No Food Insecurity (03/20/2023)   Hunger Vital Sign    Worried About Running Out of Food in the Last Year: Never true    Ran Out of Food in the Last Year: Never true  Transportation Needs: No Transportation Needs (03/20/2023)   PRAPARE - Administrator, Civil Service (Medical): No    Lack of Transportation (Non-Medical): No  Physical Activity: Unknown (03/20/2023)   Exercise Vital Sign    Days of Exercise per Week: 0 days    Minutes of Exercise per Session: Not on file  Stress: No Stress Concern Present (03/20/2023)   Brendan Barnes of Occupational Health - Occupational Stress Questionnaire    Feeling of Stress : Not at all  Social Connections: Moderately Isolated (03/20/2023)   Social Connection and Isolation Panel [NHANES]    Frequency of Communication with Friends and Family: More than three times a week    Frequency of Social Gatherings with Friends and Family: More than three times a week    Attends Religious Services: Never    Database administrator or Organizations: Yes    Attends Engineer, structural: More than 4 times per year    Marital Status: Widowed  Catering manager Violence: Not on file   Review of Systems Appetite is good Weight is  stable Wears seat belt Teeth are fine--keeps up with dentist No heartburn or dysphagia Bowels move okay with miralax--no blood No sig back or joint pains No suspicious skin lesions    Objective:   Physical Exam Constitutional:      Appearance: Normal appearance.  HENT:     Mouth/Throat:     Pharynx: No oropharyngeal exudate or posterior oropharyngeal erythema.  Eyes:     Conjunctiva/sclera: Conjunctivae normal.     Pupils: Pupils are equal, round, and reactive to light.  Cardiovascular:     Rate and Rhythm: Normal rate and regular rhythm.     Heart sounds: No murmur heard.    No gallop.  Comments: Faint pedal pulses Pulmonary:     Effort: Pulmonary effort is normal.     Breath sounds: Normal breath sounds. No wheezing or rales.  Abdominal:     Palpations: Abdomen is soft.     Tenderness: There is no abdominal tenderness.  Musculoskeletal:     Cervical back: Neck supple.     Comments: Hammertoes on left --- 4th and 5th Trace ankle edema  Lymphadenopathy:     Cervical: No cervical adenopathy.  Skin:    Findings: No lesion or rash.  Neurological:     General: No focal deficit present.     Mental Status: He is alert and oriented to person, place, and time.     Comments: Word naming 14/30 seconds Recall 3/3 Mild reduced sensation in feet  Psychiatric:        Mood and Affect: Mood normal.        Behavior: Behavior normal.            Assessment & Plan:

## 2023-09-30 LAB — MICROALBUMIN / CREATININE URINE RATIO
Creatinine,U: 44.4 mg/dL
Microalb Creat Ratio: 1.6 mg/g (ref 0.0–30.0)
Microalb, Ur: <0.7 mg/dL (ref 0.0–1.9)

## 2023-11-12 DIAGNOSIS — B351 Tinea unguium: Secondary | ICD-10-CM | POA: Diagnosis not present

## 2023-11-12 DIAGNOSIS — E1159 Type 2 diabetes mellitus with other circulatory complications: Secondary | ICD-10-CM | POA: Diagnosis not present

## 2023-11-17 ENCOUNTER — Other Ambulatory Visit: Payer: Self-pay | Admitting: Internal Medicine

## 2023-12-03 DIAGNOSIS — D1801 Hemangioma of skin and subcutaneous tissue: Secondary | ICD-10-CM | POA: Diagnosis not present

## 2023-12-03 DIAGNOSIS — L814 Other melanin hyperpigmentation: Secondary | ICD-10-CM | POA: Diagnosis not present

## 2023-12-03 DIAGNOSIS — L820 Inflamed seborrheic keratosis: Secondary | ICD-10-CM | POA: Diagnosis not present

## 2023-12-03 DIAGNOSIS — L853 Xerosis cutis: Secondary | ICD-10-CM | POA: Diagnosis not present

## 2023-12-03 DIAGNOSIS — L821 Other seborrheic keratosis: Secondary | ICD-10-CM | POA: Diagnosis not present

## 2023-12-03 DIAGNOSIS — L905 Scar conditions and fibrosis of skin: Secondary | ICD-10-CM | POA: Diagnosis not present

## 2023-12-03 DIAGNOSIS — L219 Seborrheic dermatitis, unspecified: Secondary | ICD-10-CM | POA: Diagnosis not present

## 2023-12-22 DIAGNOSIS — H2513 Age-related nuclear cataract, bilateral: Secondary | ICD-10-CM | POA: Diagnosis not present

## 2023-12-22 DIAGNOSIS — H02105 Unspecified ectropion of left lower eyelid: Secondary | ICD-10-CM | POA: Diagnosis not present

## 2023-12-22 DIAGNOSIS — E119 Type 2 diabetes mellitus without complications: Secondary | ICD-10-CM | POA: Diagnosis not present

## 2023-12-22 LAB — HM DIABETES EYE EXAM

## 2024-02-09 ENCOUNTER — Other Ambulatory Visit: Payer: Self-pay | Admitting: Internal Medicine

## 2024-02-09 NOTE — Telephone Encounter (Signed)
 Copied from CRM [id number]. Topic: Clinical - Medication Refill >> Feb 09, 2024 11:06 AM Drema Balzarine wrote: Most Recent Primary Care Visit:  Provider: Tillman Abide I  Department: [facility]  Visit Type: PHYSICAL  Date: 09/29/2023  Medication: meloxicam   Has the patient contacted their pharmacy? Yes (Agent: If no, request that the patient contact the pharmacy for the refill. If patient does not wish to contact the pharmacy document the reason why and proceed with request.) (Agent: If yes, when and what did the pharmacy advise?)  Is this the correct pharmacy for this prescription? Yes If no, delete pharmacy and type the correct one.  This is the patient's preferred pharmacy:    [facility] - [place], [place] - [address] [place] [address] Phone: [phone] Fax: [phone]   Has the prescription been filled recently? Yes  Is the patient out of the medication? No, 1 left   Has the patient been seen for an appointment in the last year OR does the patient have an upcoming appointment? Yes  Can we respond through MyChart? Yes  Agent: Please be advised that Rx refills may take up to 3 business days. We ask that you follow-up with your pharmacy.

## 2024-02-10 MED ORDER — MELOXICAM 15 MG PO TABS: 15.0000 mg | ORAL_TABLET | Freq: Every day | ORAL | 0 refills | Status: DC | PRN

## 2024-03-01 DIAGNOSIS — B351 Tinea unguium: Secondary | ICD-10-CM | POA: Diagnosis not present

## 2024-03-01 DIAGNOSIS — E1159 Type 2 diabetes mellitus with other circulatory complications: Secondary | ICD-10-CM | POA: Diagnosis not present

## 2024-03-04 DIAGNOSIS — L219 Seborrheic dermatitis, unspecified: Secondary | ICD-10-CM | POA: Diagnosis not present

## 2024-03-04 DIAGNOSIS — L814 Other melanin hyperpigmentation: Secondary | ICD-10-CM | POA: Diagnosis not present

## 2024-03-04 DIAGNOSIS — L821 Other seborrheic keratosis: Secondary | ICD-10-CM | POA: Diagnosis not present

## 2024-04-05 ENCOUNTER — Encounter: Payer: Self-pay | Admitting: Internal Medicine

## 2024-04-05 ENCOUNTER — Ambulatory Visit: Payer: Self-pay | Admitting: Internal Medicine

## 2024-04-05 ENCOUNTER — Ambulatory Visit (INDEPENDENT_AMBULATORY_CARE_PROVIDER_SITE_OTHER): Payer: Medicare Other | Admitting: Internal Medicine

## 2024-04-05 ENCOUNTER — Telehealth: Payer: Self-pay

## 2024-04-05 VITALS — BP 120/82 | HR 73 | Ht 70.0 in | Wt 193.0 lb

## 2024-04-05 DIAGNOSIS — E1140 Type 2 diabetes mellitus with diabetic neuropathy, unspecified: Secondary | ICD-10-CM | POA: Diagnosis not present

## 2024-04-05 DIAGNOSIS — M150 Primary generalized (osteo)arthritis: Secondary | ICD-10-CM | POA: Diagnosis not present

## 2024-04-05 DIAGNOSIS — I10 Essential (primary) hypertension: Secondary | ICD-10-CM

## 2024-04-05 DIAGNOSIS — Z7984 Long term (current) use of oral hypoglycemic drugs: Secondary | ICD-10-CM

## 2024-04-05 LAB — POCT GLYCOSYLATED HEMOGLOBIN (HGB A1C): Hemoglobin A1C: 8.4 % — AB (ref 4.0–5.6)

## 2024-04-05 MED ORDER — ACCU-CHEK GUIDE CONTROL VI LIQD: 1.0000 | Freq: Once | 2 refills | Status: DC

## 2024-04-05 MED ORDER — ACCU-CHEK SOFTCLIX LANCETS MISC: 4 refills | Status: DC

## 2024-04-05 MED ORDER — GABAPENTIN 600 MG PO TABS: 300.0000 mg | ORAL_TABLET | Freq: Three times a day (TID) | ORAL | 3 refills | Status: AC

## 2024-04-05 MED ORDER — ACCU-CHEK GUIDE TEST VI STRP: ORAL_STRIP | 12 refills | Status: DC

## 2024-04-05 MED ORDER — MELOXICAM 15 MG PO TABS: 15.0000 mg | ORAL_TABLET | Freq: Every day | ORAL | 0 refills | Status: DC | PRN

## 2024-04-05 MED ORDER — POTASSIUM CHLORIDE CRYS ER 20 MEQ PO TBCR: 20.0000 meq | EXTENDED_RELEASE_TABLET | Freq: Two times a day (BID) | ORAL | 3 refills | Status: DC

## 2024-04-05 MED ORDER — EMPAGLIFLOZIN 10 MG PO TABS: 10.0000 mg | ORAL_TABLET | Freq: Every day | ORAL | 3 refills | Status: AC

## 2024-04-05 NOTE — Assessment & Plan Note (Signed)
 BP Readings from Last 3 Encounters:  04/05/24 120/82  09/29/23 124/80  03/24/23 118/76   Controlled on lisinopril /hydrochlorothiazide  20/25 and amlodipine  5mg  daily

## 2024-04-05 NOTE — Assessment & Plan Note (Signed)
 A1c much worse at 8.4% Will start jardiance 10mg  daily Recheck 3 months Uses gabapentin  300mg  at bedtime

## 2024-04-05 NOTE — Patient Instructions (Signed)
 Neck Exercises Ask your health care provider which exercises are safe for you. Do exercises exactly as told by your health care provider and adjust them as directed. It is normal to feel mild stretching, pulling, tightness, or discomfort as you do these exercises. Stop right away if you feel sudden pain or your pain gets worse. Do not begin these exercises until told by your health care provider. Neck exercises can be important for many reasons. They can improve strength and maintain flexibility in your neck, which will help your upper back and prevent neck pain. Stretching exercises Rotation neck stretching  Sit in a chair or stand up. Place your feet flat on the floor, shoulder-width apart. Slowly turn your head (rotate) to the right until a slight stretch is felt. Turn it all the way to the right so you can look over your right shoulder. Do not tilt or tip your head. Hold this position for 10-30 seconds. Slowly turn your head (rotate) to the left until a slight stretch is felt. Turn it all the way to the left so you can look over your left shoulder. Do not tilt or tip your head. Hold this position for 10-30 seconds. Repeat __________ times. Complete this exercise __________ times a day. Neck retraction  Sit in a sturdy chair or stand up. Look straight ahead. Do not bend your neck. Use your fingers to push your chin backward (retraction). Do not bend your neck for this movement. Continue to face straight ahead. If you are doing the exercise properly, you will feel a slight sensation in your throat and a stretch at the back of your neck. Hold the stretch for 1-2 seconds. Repeat __________ times. Complete this exercise __________ times a day. Strengthening exercises Neck press  Lie on your back on a firm bed or on the floor with a pillow under your head. Use your neck muscles to push your head down on the pillow and straighten your spine. Hold the position as well as you can. Keep your head  facing up (in a neutral position) and your chin tucked. Slowly count to 5 while holding this position. Repeat __________ times. Complete this exercise __________ times a day. Isometrics These are exercises in which you strengthen the muscles in your neck while keeping your neck still (isometrics). Sit in a supportive chair and place your hand on your forehead. Keep your head and face facing straight ahead. Do not flex or extend your neck while doing isometrics. Push forward with your head and neck while pushing back with your hand. Hold for 10 seconds. Do the sequence again, this time putting your hand against the back of your head. Use your head and neck to push backward against the hand pressure. Finally, do the same exercise on either side of your head, pushing sideways against the pressure of your hand. Repeat __________ times. Complete this exercise __________ times a day. Prone head lifts  Lie face-down (prone position), resting on your elbows so that your chest and upper back are raised. Start with your head facing downward, near your chest. Position your chin either on or near your chest. Slowly lift your head upward. Lift until you are looking straight ahead. Then continue lifting your head as far back as you can comfortably stretch. Hold your head up for 5 seconds. Then slowly lower it to your starting position. Repeat __________ times. Complete this exercise __________ times a day. Supine head lifts  Lie on your back (supine position), bending your knees  to point to the ceiling and keeping your feet flat on the floor. Lift your head slowly off the floor, raising your chin toward your chest. Hold for 5 seconds. Repeat __________ times. Complete this exercise __________ times a day. Scapular retraction  Stand with your arms at your sides. Look straight ahead. Slowly pull both shoulders (scapulae) backward and downward (retraction) until you feel a stretch between your shoulder  blades in your upper back. Hold for 10-30 seconds. Relax and repeat. Repeat __________ times. Complete this exercise __________ times a day. Contact a health care provider if: Your neck pain or discomfort gets worse when you do an exercise. Your neck pain or discomfort does not improve within 2 hours after you exercise. If you have any of these problems, stop exercising right away. Do not do the exercises again unless your health care provider says that you can. Get help right away if: You develop sudden, severe neck pain. If this happens, stop exercising right away. Do not do the exercises again unless your health care provider says that you can. This information is not intended to replace advice given to you by your health care provider. Make sure you discuss any questions you have with your health care provider. Document Revised: 04/17/2021 Document Reviewed: 04/17/2021 Elsevier Patient Education  2024 [employer].

## 2024-04-05 NOTE — Assessment & Plan Note (Signed)
 Neck now a problem--but better sleeping in the recliner Uses the meloxicam  twice a week or so

## 2024-04-05 NOTE — Addendum Note (Signed)
 Addended by: Franne Ivory on: 04/05/2024 11:50 AM   Modules accepted: Orders

## 2024-04-05 NOTE — Telephone Encounter (Signed)
 Rx sent electronically.

## 2024-04-05 NOTE — Progress Notes (Signed)
 Subjective:    Patient ID: Brendan Barnes, male    DOB: [birth year], 83 y.o.   MRN: [id number]  HPI Here for follow up of diabetes and other chronic health conditions  Changed beds---"it has been a disaster". Having neck pain now--same pillow Improved since he moved to the recliner Has been using heat---tried horse linament also Tylenol no help---uses the meloxicam  at times which helps  Got mouth appliance for sleep apnea---gave him headaches From Dr Sumner Ends  Has been checking his sugars Hasn't been below 190 since last time in Uses the gabapentin  at bedtime --300mg  now  Has seen specialists for lumbar and cervical spine symptoms Surgery the only option--and doing better with the gabapentin   Current Outpatient Medications on File Prior to Visit  Medication Sig Dispense Refill   Accu-Chek Softclix Lancets lancets Use to obtain blood sugar once sample daily 100 each 4   amLODipine  (NORVASC ) 5 MG tablet Take 1 tablet (5 mg total) by mouth daily. 90 tablet 3   finasteride  (PROSCAR ) 5 MG tablet TAKE 1 TABLET BY MOUTH ONCE DAILY 100 tablet 3   gabapentin  (NEURONTIN ) 100 MG capsule Take 1-2 capsules (100-200 mg total) by mouth at bedtime. 180 capsule 3   glucose blood (ACCU-CHEK GUIDE) test strip Use to check blood sugar once daily 100 each 4   ipratropium (ATROVENT) 0.03 % nasal spray Place 2 sprays into both nostrils every 12 (twelve) hours.     levocetirizine (XYZAL) 5 MG tablet Take 5 mg by mouth daily as needed.     lisinopril -hydrochlorothiazide  (ZESTORETIC ) 20-25 MG tablet TAKE 1 TABLET BY MOUTH ONCE DAILY 100 tablet 3   meloxicam  (MOBIC ) 15 MG tablet Take 1 tablet (15 mg total) by mouth daily as needed. 30 tablet 0   Multiple Vitamin (MULTIVITAMIN) tablet Take 1 tablet by mouth daily.     polyethylene glycol (MIRALAX / GLYCOLAX) packet Take 17 g by mouth 3 (three) times a week.     potassium chloride  SA (KLOR-CON  M) 20 MEQ tablet TAKE 1 TABLET BY MOUTH TWICE DAILY 200 tablet 3    simvastatin  (ZOCOR ) 20 MG tablet TAKE 1 TABLET BY MOUTH ONCE DAILY 100 tablet 3   Blood Glucose Calibration (ACCU-CHEK GUIDE CONTROL) LIQD 1 each by In Vitro route once for 1 dose. 1 each 2   No current facility-administered medications on file prior to visit.    No Known Allergies  Past Medical History:  Diagnosis Date   BPH (benign prostatic hypertrophy)    Diabetes mellitus without complication (HCC)    ED (erectile dysfunction)    HLD (hyperlipidemia)    HTN (hypertension)    IBS (irritable bowel syndrome)    Osteoarthritis     Past Surgical History:  Procedure Laterality Date   COLONOSCOPY WITH PROPOFOL  N/A 01/06/2017   Procedure: COLONOSCOPY WITH PROPOFOL ;  Surgeon: Deveron Fly, MD;  Location: [facility] ENDOSCOPY;  Service: Endoscopy;  Laterality: N/A;   nasal "growth"  2000   toenail removal     US  ECHOCARDIOGRAPHY  06/2005   LV EF normal, Mild LVH, LAE    Family History  Problem Relation Age of Onset   Prostate cancer Father        prostate   Hyperlipidemia Father    Arrhythmia Father    Kidney failure Mother    Heart failure Mother    Gout Mother    Scoliosis Son     Social History   Socioeconomic History   Marital status: Widowed  Spouse name: Not on file   Number of children: 2   Years of education: Not on file   Highest education level: Bachelor's degree (e.g., BA, AB, BS)  Occupational History   Occupation: Retired    Veterinary surgeon: Company secretary, 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[employer]  Tobacco Use   Smoking status: Never    Passive exposure: Never   Smokeless tobacco: Never  Vaping Use   Vaping status: Never Used  Substance and Sexual Activity   Alcohol use: Not Currently   Drug use: No   Sexual activity: Not on file  Other Topics Concern   Not on file  Social History Narrative   Widowed 2021      Has living will   Son Kyal is his health care POA   Would accept resuscitation attempts   Might accept tube feeds based on situation   Social Drivers of Health    Financial Resource Strain: Low Risk  (04/01/2024)   Overall Financial Resource Strain (CARDIA)    Difficulty of Paying Living Expenses: Not hard at all  Food Insecurity: No Food Insecurity (04/01/2024)   Hunger Vital Sign    Worried About Running Out of Food in the Last Year: Never true    Ran Out of Food in the Last Year: Never true  Transportation Needs: No Transportation Needs (04/01/2024)   PRAPARE - Administrator, Civil Service (Medical): No    Lack of Transportation (Non-Medical): No  Physical Activity: Unknown (04/01/2024)   Exercise Vital Sign    Days of Exercise per Week: 0 days    Minutes of Exercise per Session: Not on file  Stress: No Stress Concern Present (04/01/2024)   [employer] of Occupational Health - Occupational Stress Questionnaire    Feeling of Stress : Not at all  Social Connections: Socially Isolated (04/01/2024)   Social Connection and Isolation Panel [NHANES]    Frequency of Communication with Friends and Family: Once a week    Frequency of Social Gatherings with Friends and Family: More than three times a week    Attends Religious Services: Never    Database administrator or Organizations: No    Attends Banker Meetings: Not on file    Marital Status: Widowed  Catering manager Violence: Not on file   Review of Systems Continuous runny nose---Dr Tommye Franc not having success trying to control it (ipratropium and ablations) Also uses xyzal prn 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  HENT:     Right Ear: Tympanic membrane and ear canal normal.     Left Ear: Tympanic membrane and ear canal normal.     Mouth/Throat:     Pharynx: No oropharyngeal exudate or posterior oropharyngeal erythema.  Neck:     Comments: Decreased ROM --especially extension and rotation Cardiovascular:      Rate and Rhythm: Normal rate and regular rhythm.     Heart sounds: No murmur heard.    No gallop.  Pulmonary:     Effort: Pulmonary effort is normal.     Breath sounds: Normal breath sounds. No wheezing or rales.  Musculoskeletal:     Right lower leg: No edema.     Left lower leg: No edema.  Lymphadenopathy:     Cervical: No cervical adenopathy.  Skin:    Comments: No foot lesions  Neurological:  Mental Status: He is alert.            Assessment & Plan:

## 2024-04-06 ENCOUNTER — Other Ambulatory Visit (HOSPITAL_COMMUNITY): Payer: Self-pay

## 2024-04-06 ENCOUNTER — Telehealth: Payer: Self-pay | Admitting: Pharmacy Technician

## 2024-04-06 NOTE — Telephone Encounter (Signed)
 Pharmacy Patient Advocate Encounter   Received notification from Onbase that prior authorization for Accu-chek Guide testing supplies is required/requested.   Insurance verification completed.   The patient is insured through [employer] .   Per test claim:  FREESTYLE LITE is preferred by the insurance.  If suggested medication is appropriate, Please send in a new RX and discontinue this one. If not, please advise as to why it's not appropriate so that we may request a Prior Authorization. Please note, some preferred medications may still require a PA.  If the suggested medications have not been trialed and there are no contraindications to their use, the PA will not be submitted, as it will not be approved.

## 2024-04-07 MED ORDER — BLOOD GLUCOSE TEST VI STRP: 1.0000 | ORAL_STRIP | Freq: Three times a day (TID) | 0 refills | Status: DC

## 2024-04-07 MED ORDER — LANCET DEVICE MISC
1.0000 | Freq: Three times a day (TID) | 0 refills | Status: DC
Start: 2024-04-07 — End: 2024-04-14

## 2024-04-07 MED ORDER — BLOOD GLUCOSE MONITORING SUPPL DEVI
1.0000 | Freq: Three times a day (TID) | 0 refills | Status: DC
Start: 2024-04-07 — End: 2024-04-15

## 2024-04-07 MED ORDER — LANCETS MISC. MISC
1.0000 | Freq: Three times a day (TID) | 0 refills | Status: DC
Start: 2024-04-07 — End: 2024-04-14

## 2024-04-07 NOTE — Telephone Encounter (Signed)
Ok to send as pended.

## 2024-04-07 NOTE — Telephone Encounter (Signed)
 Sent as requested.

## 2024-04-14 ENCOUNTER — Telehealth: Payer: Self-pay

## 2024-04-14 MED ORDER — LANCETS MISC. MISC: 1.0000 | Freq: Every day | 3 refills | Status: AC

## 2024-04-14 MED ORDER — LANCET DEVICE MISC: 1.0000 | Freq: Every day | 0 refills | Status: DC

## 2024-04-14 MED ORDER — BLOOD GLUCOSE TEST VI STRP
1.0000 | ORAL_STRIP | Freq: Every day | 3 refills | Status: DC
Start: 2024-04-14 — End: 2025-05-19

## 2024-04-14 NOTE — Telephone Encounter (Signed)
 Copied from CRM [id number]. Topic: Clinical - Medication Prior Auth >> Apr 14, 2024  3:44 PM Martinique E wrote: Reason for CRM: Patient stated he received a message from his pharmacy stating that his glucose blood (ACCU-CHEK GUIDE TEST) test strips will need prior authorization.

## 2024-04-14 NOTE — Telephone Encounter (Signed)
 Called pt to verify pharmacy. He said he uses [facility]. So, I sent the new rxs to them.

## 2024-04-14 NOTE — Addendum Note (Signed)
 Addended by: Franne Ivory on: 04/14/2024 04:31 PM   Modules accepted: Orders

## 2024-04-14 NOTE — Telephone Encounter (Signed)
 Spoke to Nicholson at [facility]. They no longer accept [employer].

## 2024-04-15 MED ORDER — FREESTYLE LANCETS MISC: 4 refills | Status: AC

## 2024-04-15 MED ORDER — LANCET DEVICE MISC: 1.0000 | Freq: Once | 0 refills | Status: AC

## 2024-04-15 MED ORDER — FREESTYLE LITE TEST VI STRP: ORAL_STRIP | 4 refills | Status: AC

## 2024-04-15 MED ORDER — FREESTYLE LITE DEVI: 1.0000 | Freq: Once | 0 refills | Status: AC

## 2024-04-15 NOTE — Telephone Encounter (Signed)
 Rx sent electronically.

## 2024-04-15 NOTE — Telephone Encounter (Signed)
 Left message on VM per DPR advising pt I have found on the [employer] website, they cover the Freestyle Lite Meter. Asking if he wants that sent to [facility] or [facility].

## 2024-04-15 NOTE — Addendum Note (Signed)
 Addended by: Franne Ivory on: 04/15/2024 02:56 PM   Modules accepted: Orders

## 2024-04-15 NOTE — Telephone Encounter (Unsigned)
 Copied from CRM [id number]. Topic: General - Other >> Apr 15, 2024  4:28 PM Mesmerise C wrote: Reason for CRM: Patient returning call back stated he wants his Freestyle [employer] and [facility]

## 2024-04-15 NOTE — Telephone Encounter (Signed)
 Copied from CRM [id number]. Topic: Clinical - Prescription Issue >> Apr 15, 2024 12:34 PM Clyde Darling P wrote: Reason for CRM: Pt advise [facility] advise they are unable to fill the prescription BLOOD GLUCOSE TEST STRIPS & Meter, pt would like to have it sent to [facility] - [place], [place] - [address]

## 2024-05-03 DIAGNOSIS — B351 Tinea unguium: Secondary | ICD-10-CM | POA: Diagnosis not present

## 2024-05-03 DIAGNOSIS — E1159 Type 2 diabetes mellitus with other circulatory complications: Secondary | ICD-10-CM | POA: Diagnosis not present

## 2024-05-17 ENCOUNTER — Telehealth: Payer: Self-pay | Admitting: Internal Medicine

## 2024-05-17 MED ORDER — MELOXICAM 15 MG PO TABS: 15.0000 mg | ORAL_TABLET | Freq: Every day | ORAL | 0 refills | Status: AC | PRN

## 2024-05-17 NOTE — Telephone Encounter (Signed)
 Rx sent electronically.

## 2024-05-17 NOTE — Telephone Encounter (Signed)
 Copied from CRM [id number]. Topic: Clinical - Medication Refill >> May 17, 2024  9:43 AM Chasity T wrote: Medication: meloxicam  (MOBIC ) 15 MG tablet   Has the patient contacted their pharmacy? Yes   This is the patient's preferred pharmacy:    [facility] - [place], [place] - [address] [place] [address] Phone: [phone] Fax: [phone]  Is this the correct pharmacy for this prescription? Yes If no, delete pharmacy and type the correct one.   Has the prescription been filled recently? Yes  Is the patient out of the medication? Yes  Has the patient been seen for an appointment in the last year OR does the patient have an upcoming appointment? Yes  Can we respond through MyChart? Yes  Agent: Please be advised that Rx refills may take up to 3 business days. We ask that you follow-up with your pharmacy.

## 2024-06-07 ENCOUNTER — Ambulatory Visit: Admitting: Internal Medicine

## 2024-06-10 DIAGNOSIS — Z872 Personal history of diseases of the skin and subcutaneous tissue: Secondary | ICD-10-CM | POA: Diagnosis not present

## 2024-06-10 DIAGNOSIS — D2272 Melanocytic nevi of left lower limb, including hip: Secondary | ICD-10-CM | POA: Diagnosis not present

## 2024-06-10 DIAGNOSIS — L821 Other seborrheic keratosis: Secondary | ICD-10-CM | POA: Diagnosis not present

## 2024-06-10 DIAGNOSIS — Z09 Encounter for follow-up examination after completed treatment for conditions other than malignant neoplasm: Secondary | ICD-10-CM | POA: Diagnosis not present

## 2024-06-10 DIAGNOSIS — D225 Melanocytic nevi of trunk: Secondary | ICD-10-CM | POA: Diagnosis not present

## 2024-06-10 DIAGNOSIS — Z85828 Personal history of other malignant neoplasm of skin: Secondary | ICD-10-CM | POA: Diagnosis not present

## 2024-06-10 DIAGNOSIS — S70362A Insect bite (nonvenomous), left thigh, initial encounter: Secondary | ICD-10-CM | POA: Diagnosis not present

## 2024-06-10 DIAGNOSIS — D2261 Melanocytic nevi of right upper limb, including shoulder: Secondary | ICD-10-CM | POA: Diagnosis not present

## 2024-06-10 DIAGNOSIS — Z08 Encounter for follow-up examination after completed treatment for malignant neoplasm: Secondary | ICD-10-CM | POA: Diagnosis not present

## 2024-06-10 DIAGNOSIS — D2262 Melanocytic nevi of left upper limb, including shoulder: Secondary | ICD-10-CM | POA: Diagnosis not present

## 2024-06-10 DIAGNOSIS — D2271 Melanocytic nevi of right lower limb, including hip: Secondary | ICD-10-CM | POA: Diagnosis not present

## 2024-06-29 ENCOUNTER — Ambulatory Visit (INDEPENDENT_AMBULATORY_CARE_PROVIDER_SITE_OTHER): Admitting: Internal Medicine

## 2024-06-29 ENCOUNTER — Ambulatory Visit: Payer: Self-pay | Admitting: Internal Medicine

## 2024-06-29 ENCOUNTER — Encounter: Payer: Self-pay | Admitting: Internal Medicine

## 2024-06-29 ENCOUNTER — Telehealth: Payer: Self-pay | Admitting: Internal Medicine

## 2024-06-29 ENCOUNTER — Other Ambulatory Visit: Payer: Self-pay | Admitting: Internal Medicine

## 2024-06-29 VITALS — BP 136/88 | HR 68 | Temp 98.0°F | Ht 70.0 in | Wt 191.0 lb

## 2024-06-29 DIAGNOSIS — E119 Type 2 diabetes mellitus without complications: Secondary | ICD-10-CM | POA: Diagnosis not present

## 2024-06-29 DIAGNOSIS — I10 Essential (primary) hypertension: Secondary | ICD-10-CM | POA: Diagnosis not present

## 2024-06-29 DIAGNOSIS — M4802 Spinal stenosis, cervical region: Secondary | ICD-10-CM | POA: Diagnosis not present

## 2024-06-29 DIAGNOSIS — Z7984 Long term (current) use of oral hypoglycemic drugs: Secondary | ICD-10-CM | POA: Diagnosis not present

## 2024-06-29 DIAGNOSIS — E1140 Type 2 diabetes mellitus with diabetic neuropathy, unspecified: Secondary | ICD-10-CM

## 2024-06-29 LAB — POCT GLYCOSYLATED HEMOGLOBIN (HGB A1C): Hemoglobin A1C: 6.5 % — AB (ref 4.0–5.6)

## 2024-06-29 NOTE — Assessment & Plan Note (Signed)
 BP Readings from Last 3 Encounters:  06/29/24 136/88  04/05/24 120/82  09/29/23 124/80   Controlled on lisinopril /hydrochlorothiazide  20/25

## 2024-06-29 NOTE — Progress Notes (Signed)
 Subjective:    Patient ID: Brendan Barnes, male    DOB: 10-12-1941, 83 y.o.   MRN: [id number]  HPI Here for follow up of diabetes and other chronic health conditions  Has ongoing neck pain Wants to try PT --at [facility] Not interested in surgery for cervical spinal stenosis Still gets aching in arms at night Gabapentin  helps May improve with some neck/shoulder movements Now sleeping in recliner instead of the bed---this is better  Checks sugars three times a week Usually around 140 fasting No low sugar reactions  No chest pain No SOB No dizziness  or syncope  Current Outpatient Medications on File Prior to Visit  Medication Sig Dispense Refill   amLODipine  (NORVASC ) 5 MG tablet Take 1 tablet (5 mg total) by mouth daily. 90 tablet 3   empagliflozin  (JARDIANCE ) 10 MG TABS tablet Take 1 tablet (10 mg total) by mouth daily. 90 tablet 3   finasteride  (PROSCAR ) 5 MG tablet TAKE 1 TABLET BY MOUTH ONCE DAILY 100 tablet 3   FREESTYLE LITE test strip Use to check blood sugar once daily 100 each 4   gabapentin  (NEURONTIN ) 600 MG tablet Take 0.5-1 tablets (300-600 mg total) by mouth 3 (three) times daily. 90 tablet 3   ipratropium (ATROVENT) 0.03 % nasal spray Place 2 sprays into both nostrils every 12 (twelve) hours.     Lancets (FREESTYLE) lancets Use to obtain blood sugar sample once daily 100 each 4   levocetirizine (XYZAL) 5 MG tablet Take 5 mg by mouth daily as needed.     lisinopril -hydrochlorothiazide  (ZESTORETIC ) 20-25 MG tablet TAKE 1 TABLET BY MOUTH ONCE DAILY 100 tablet 3   meloxicam  (MOBIC ) 15 MG tablet Take 1 tablet (15 mg total) by mouth daily as needed. 90 tablet 0   polyethylene glycol (MIRALAX / GLYCOLAX) packet Take 17 g by mouth 3 (three) times a week.     potassium chloride  SA (KLOR-CON  M) 20 MEQ tablet Take 1 tablet (20 mEq total) by mouth 2 (two) times daily. 200 tablet 3   simvastatin  (ZOCOR ) 20 MG tablet TAKE 1 TABLET BY MOUTH ONCE DAILY 100 tablet 3    No current facility-administered medications on file prior to visit.    No Known Allergies  Past Medical History:  Diagnosis Date   BPH (benign prostatic hypertrophy)    Diabetes mellitus without complication (HCC)    ED (erectile dysfunction)    HLD (hyperlipidemia)    HTN (hypertension)    IBS (irritable bowel syndrome)    Osteoarthritis     Past Surgical History:  Procedure Laterality Date   COLONOSCOPY WITH PROPOFOL  N/A 01/06/2017   Procedure: COLONOSCOPY WITH PROPOFOL ;  Surgeon: Gladis RAYMOND Mariner, MD;  Location: [facility] ENDOSCOPY;  Service: Endoscopy;  Laterality: N/A;   nasal growth  2000   toenail removal     US  ECHOCARDIOGRAPHY  06/2005   LV EF normal, Mild LVH, LAE    Family History  Problem Relation Age of Onset   Prostate cancer Father        prostate   Hyperlipidemia Father    Arrhythmia Father    Kidney failure Mother    Heart failure Mother    Gout Mother    Scoliosis Son     Social History   Socioeconomic History   Marital status: Widowed    Spouse name: Not on file   Number of children: 2   Years of education: Not on file   Highest education level: Bachelor's degree (e.g.,  BA, AB, BS)  Occupational History   Occupation: Retired    Veterinary surgeon: Company secretary, 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[employer]  Tobacco Use   Smoking status: Never    Passive exposure: Never   Smokeless tobacco: Never  Vaping Use   Vaping status: Never Used  Substance and Sexual Activity   Alcohol use: Not Currently   Drug use: No   Sexual activity: Not on file  Other Topics Concern   Not on file  Social History Narrative   Widowed 2021      Has living will   Son Maximilian is his health care POA   Would accept resuscitation attempts   Might accept tube feeds based on situation   Social Drivers of Health   Financial Resource Strain: Low Risk  (06/25/2024)   Overall Financial Resource Strain (CARDIA)    Difficulty of Paying Living Expenses: Not hard at all  Food Insecurity: No Food Insecurity  (06/25/2024)   Hunger Vital Sign    Worried About Running Out of Food in the Last Year: Never true    Ran Out of Food in the Last Year: Never true  Transportation Needs: No Transportation Needs (06/25/2024)   PRAPARE - Administrator, Civil Service (Medical): No    Lack of Transportation (Non-Medical): No  Physical Activity: Inactive (06/25/2024)   Exercise Vital Sign    Days of Exercise per Week: 0 days    Minutes of Exercise per Session: Not on file  Stress: No Stress Concern Present (06/25/2024)   [employer] of Occupational Health - Occupational Stress Questionnaire    Feeling of Stress: Not at all  Social Connections: Socially Isolated (06/25/2024)   Social Connection and Isolation Panel    Frequency of Communication with Friends and Family: Once a week    Frequency of Social Gatherings with Friends and Family: Once a week    Attends Religious Services: Never    Database administrator or Organizations: Yes    Attends Engineer, structural: More than 4 times per year    Marital Status: Widowed  Catering manager Violence: Not on file   Review of Systems Ongoing trouble with voice---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  HENT:     Right Ear: Tympanic membrane and ear canal normal.     Left Ear: Tympanic membrane and ear canal normal.  Cardiovascular:     Rate and Rhythm: Normal rate and regular rhythm.     Pulses: Normal pulses.     Heart sounds: No murmur heard.    No gallop.  Pulmonary:     Effort: Pulmonary effort is normal.     Breath sounds: Normal breath sounds. No wheezing or rales.  Musculoskeletal:     Cervical back: Normal range of motion and neck supple.     Right lower leg: No edema.     Left lower leg: No edema.  Lymphadenopathy:     Cervical: No cervical adenopathy.  Neurological:      Mental Status: He is alert.     Comments: No arm weakness            Assessment & Plan:

## 2024-06-29 NOTE — Telephone Encounter (Signed)
 Left message on patient's phone to call [facility] office if he would like to do a TOC to Dr Abbey. Please schedule out to March 2026.

## 2024-06-29 NOTE — Assessment & Plan Note (Signed)
 A1c much better on jardiance  10--down to 6.5% Continue gabapentin  for neuropathy

## 2024-06-29 NOTE — Addendum Note (Signed)
 Addended by: KALLIE CLOTILDA SQUIBB on: 06/29/2024 12:33 PM   Modules accepted: Orders

## 2024-06-29 NOTE — Assessment & Plan Note (Signed)
 Will refer to PT for evaluation and treatment

## 2024-07-05 DIAGNOSIS — M6281 Muscle weakness (generalized): Secondary | ICD-10-CM | POA: Diagnosis not present

## 2024-07-05 DIAGNOSIS — R279 Unspecified lack of coordination: Secondary | ICD-10-CM | POA: Diagnosis not present

## 2024-07-05 DIAGNOSIS — M4802 Spinal stenosis, cervical region: Secondary | ICD-10-CM | POA: Diagnosis not present

## 2024-07-08 DIAGNOSIS — M4802 Spinal stenosis, cervical region: Secondary | ICD-10-CM | POA: Diagnosis not present

## 2024-07-08 DIAGNOSIS — M6281 Muscle weakness (generalized): Secondary | ICD-10-CM | POA: Diagnosis not present

## 2024-07-08 DIAGNOSIS — R279 Unspecified lack of coordination: Secondary | ICD-10-CM | POA: Diagnosis not present

## 2024-07-13 DIAGNOSIS — M6281 Muscle weakness (generalized): Secondary | ICD-10-CM | POA: Diagnosis not present

## 2024-07-13 DIAGNOSIS — R279 Unspecified lack of coordination: Secondary | ICD-10-CM | POA: Diagnosis not present

## 2024-07-13 DIAGNOSIS — M4802 Spinal stenosis, cervical region: Secondary | ICD-10-CM | POA: Diagnosis not present

## 2024-07-15 DIAGNOSIS — R279 Unspecified lack of coordination: Secondary | ICD-10-CM | POA: Diagnosis not present

## 2024-07-15 DIAGNOSIS — M6281 Muscle weakness (generalized): Secondary | ICD-10-CM | POA: Diagnosis not present

## 2024-07-15 DIAGNOSIS — M4802 Spinal stenosis, cervical region: Secondary | ICD-10-CM | POA: Diagnosis not present

## 2024-07-20 DIAGNOSIS — M6281 Muscle weakness (generalized): Secondary | ICD-10-CM | POA: Diagnosis not present

## 2024-07-20 DIAGNOSIS — R279 Unspecified lack of coordination: Secondary | ICD-10-CM | POA: Diagnosis not present

## 2024-07-20 DIAGNOSIS — M4802 Spinal stenosis, cervical region: Secondary | ICD-10-CM | POA: Diagnosis not present

## 2024-07-23 DIAGNOSIS — M6281 Muscle weakness (generalized): Secondary | ICD-10-CM | POA: Diagnosis not present

## 2024-07-23 DIAGNOSIS — M4802 Spinal stenosis, cervical region: Secondary | ICD-10-CM | POA: Diagnosis not present

## 2024-07-23 DIAGNOSIS — R279 Unspecified lack of coordination: Secondary | ICD-10-CM | POA: Diagnosis not present

## 2024-07-27 DIAGNOSIS — R279 Unspecified lack of coordination: Secondary | ICD-10-CM | POA: Diagnosis not present

## 2024-07-27 DIAGNOSIS — M6281 Muscle weakness (generalized): Secondary | ICD-10-CM | POA: Diagnosis not present

## 2024-07-27 DIAGNOSIS — M4802 Spinal stenosis, cervical region: Secondary | ICD-10-CM | POA: Diagnosis not present

## 2024-07-29 DIAGNOSIS — M4802 Spinal stenosis, cervical region: Secondary | ICD-10-CM | POA: Diagnosis not present

## 2024-07-29 DIAGNOSIS — R279 Unspecified lack of coordination: Secondary | ICD-10-CM | POA: Diagnosis not present

## 2024-07-29 DIAGNOSIS — M6281 Muscle weakness (generalized): Secondary | ICD-10-CM | POA: Diagnosis not present

## 2024-08-03 DIAGNOSIS — M6281 Muscle weakness (generalized): Secondary | ICD-10-CM | POA: Diagnosis not present

## 2024-08-03 DIAGNOSIS — M4802 Spinal stenosis, cervical region: Secondary | ICD-10-CM | POA: Diagnosis not present

## 2024-08-03 DIAGNOSIS — R279 Unspecified lack of coordination: Secondary | ICD-10-CM | POA: Diagnosis not present

## 2024-08-05 DIAGNOSIS — R279 Unspecified lack of coordination: Secondary | ICD-10-CM | POA: Diagnosis not present

## 2024-08-05 DIAGNOSIS — M6281 Muscle weakness (generalized): Secondary | ICD-10-CM | POA: Diagnosis not present

## 2024-08-05 DIAGNOSIS — M4802 Spinal stenosis, cervical region: Secondary | ICD-10-CM | POA: Diagnosis not present

## 2024-08-12 DIAGNOSIS — R279 Unspecified lack of coordination: Secondary | ICD-10-CM | POA: Diagnosis not present

## 2024-08-12 DIAGNOSIS — M4802 Spinal stenosis, cervical region: Secondary | ICD-10-CM | POA: Diagnosis not present

## 2024-08-12 DIAGNOSIS — M6281 Muscle weakness (generalized): Secondary | ICD-10-CM | POA: Diagnosis not present

## 2024-08-17 DIAGNOSIS — R279 Unspecified lack of coordination: Secondary | ICD-10-CM | POA: Diagnosis not present

## 2024-08-17 DIAGNOSIS — M6281 Muscle weakness (generalized): Secondary | ICD-10-CM | POA: Diagnosis not present

## 2024-08-17 DIAGNOSIS — M4802 Spinal stenosis, cervical region: Secondary | ICD-10-CM | POA: Diagnosis not present

## 2024-08-19 DIAGNOSIS — M4802 Spinal stenosis, cervical region: Secondary | ICD-10-CM | POA: Diagnosis not present

## 2024-08-19 DIAGNOSIS — M6281 Muscle weakness (generalized): Secondary | ICD-10-CM | POA: Diagnosis not present

## 2024-08-19 DIAGNOSIS — Z23 Encounter for immunization: Secondary | ICD-10-CM | POA: Diagnosis not present

## 2024-08-19 DIAGNOSIS — R279 Unspecified lack of coordination: Secondary | ICD-10-CM | POA: Diagnosis not present

## 2024-08-24 DIAGNOSIS — M4802 Spinal stenosis, cervical region: Secondary | ICD-10-CM | POA: Diagnosis not present

## 2024-08-24 DIAGNOSIS — M6281 Muscle weakness (generalized): Secondary | ICD-10-CM | POA: Diagnosis not present

## 2024-08-24 DIAGNOSIS — R279 Unspecified lack of coordination: Secondary | ICD-10-CM | POA: Diagnosis not present

## 2024-08-26 ENCOUNTER — Telehealth: Payer: Self-pay

## 2024-08-26 NOTE — Telephone Encounter (Signed)
 The rx we have says 20 meq 2 times daily. No note about 10 meq. The way this message reads, I only have to call or reach out if he needs to be on the 10 meq.

## 2024-08-26 NOTE — Telephone Encounter (Signed)
 Copied from CRM [id number]. Topic: Clinical - Medication Question >> Aug 26, 2024 12:58 PM Dedra B wrote: Reason for CRM: Alan, Pharmacy Tech, from [employer] calling to get clarification on potassium chloride  SA (KLOR-CON  M) 20 MEQ tablet. The prescription was transferred from [facility] There's a note on prescription that says 10 MEQ. If PCP wants pt to take both they will need a prescription for that as well. Pls call [phone] if any questions.

## 2024-08-27 DIAGNOSIS — M6281 Muscle weakness (generalized): Secondary | ICD-10-CM | POA: Diagnosis not present

## 2024-08-27 DIAGNOSIS — R279 Unspecified lack of coordination: Secondary | ICD-10-CM | POA: Diagnosis not present

## 2024-08-27 DIAGNOSIS — M4802 Spinal stenosis, cervical region: Secondary | ICD-10-CM | POA: Diagnosis not present

## 2024-08-31 DIAGNOSIS — M6281 Muscle weakness (generalized): Secondary | ICD-10-CM | POA: Diagnosis not present

## 2024-08-31 DIAGNOSIS — R279 Unspecified lack of coordination: Secondary | ICD-10-CM | POA: Diagnosis not present

## 2024-08-31 DIAGNOSIS — M4802 Spinal stenosis, cervical region: Secondary | ICD-10-CM | POA: Diagnosis not present

## 2024-09-07 DIAGNOSIS — M6281 Muscle weakness (generalized): Secondary | ICD-10-CM | POA: Diagnosis not present

## 2024-09-07 DIAGNOSIS — R279 Unspecified lack of coordination: Secondary | ICD-10-CM | POA: Diagnosis not present

## 2024-09-07 DIAGNOSIS — M4802 Spinal stenosis, cervical region: Secondary | ICD-10-CM | POA: Diagnosis not present

## 2024-09-09 DIAGNOSIS — M6281 Muscle weakness (generalized): Secondary | ICD-10-CM | POA: Diagnosis not present

## 2024-09-09 DIAGNOSIS — M4802 Spinal stenosis, cervical region: Secondary | ICD-10-CM | POA: Diagnosis not present

## 2024-09-09 DIAGNOSIS — R279 Unspecified lack of coordination: Secondary | ICD-10-CM | POA: Diagnosis not present

## 2024-09-14 DIAGNOSIS — M4802 Spinal stenosis, cervical region: Secondary | ICD-10-CM | POA: Diagnosis not present

## 2024-09-14 DIAGNOSIS — M6281 Muscle weakness (generalized): Secondary | ICD-10-CM | POA: Diagnosis not present

## 2024-09-14 DIAGNOSIS — R279 Unspecified lack of coordination: Secondary | ICD-10-CM | POA: Diagnosis not present

## 2024-09-21 DIAGNOSIS — M6281 Muscle weakness (generalized): Secondary | ICD-10-CM | POA: Diagnosis not present

## 2024-09-21 DIAGNOSIS — R279 Unspecified lack of coordination: Secondary | ICD-10-CM | POA: Diagnosis not present

## 2024-09-21 DIAGNOSIS — M4802 Spinal stenosis, cervical region: Secondary | ICD-10-CM | POA: Diagnosis not present

## 2024-09-23 DIAGNOSIS — R279 Unspecified lack of coordination: Secondary | ICD-10-CM | POA: Diagnosis not present

## 2024-09-23 DIAGNOSIS — M6281 Muscle weakness (generalized): Secondary | ICD-10-CM | POA: Diagnosis not present

## 2024-09-23 DIAGNOSIS — M4802 Spinal stenosis, cervical region: Secondary | ICD-10-CM | POA: Diagnosis not present

## 2024-09-26 DIAGNOSIS — M4802 Spinal stenosis, cervical region: Secondary | ICD-10-CM | POA: Diagnosis not present

## 2024-09-26 DIAGNOSIS — M6281 Muscle weakness (generalized): Secondary | ICD-10-CM | POA: Diagnosis not present

## 2024-09-26 DIAGNOSIS — R279 Unspecified lack of coordination: Secondary | ICD-10-CM | POA: Diagnosis not present

## 2024-09-28 ENCOUNTER — Telehealth: Payer: Self-pay

## 2024-09-28 DIAGNOSIS — M6281 Muscle weakness (generalized): Secondary | ICD-10-CM | POA: Diagnosis not present

## 2024-09-28 DIAGNOSIS — R279 Unspecified lack of coordination: Secondary | ICD-10-CM | POA: Diagnosis not present

## 2024-09-28 DIAGNOSIS — M4802 Spinal stenosis, cervical region: Secondary | ICD-10-CM | POA: Diagnosis not present

## 2024-09-28 MED ORDER — FINASTERIDE 5 MG PO TABS: 5.0000 mg | ORAL_TABLET | Freq: Every day | ORAL | 0 refills | Status: AC

## 2024-10-04 ENCOUNTER — Encounter

## 2024-11-24 ENCOUNTER — Telehealth: Payer: Self-pay

## 2024-11-24 MED ORDER — POTASSIUM CHLORIDE CRYS ER 20 MEQ PO TBCR: 20.0000 meq | EXTENDED_RELEASE_TABLET | Freq: Two times a day (BID) | ORAL | 0 refills | Status: AC

## 2024-11-24 MED ORDER — LISINOPRIL-HYDROCHLOROTHIAZIDE 20-25 MG PO TABS: 1.0000 | ORAL_TABLET | Freq: Every day | ORAL | 0 refills | Status: AC

## 2024-11-24 NOTE — Telephone Encounter (Signed)
 Rx sent electronically.

## 2024-11-24 NOTE — Addendum Note (Signed)
 Addended by: KALLIE CLOTILDA SQUIBB on: 11/24/2024 10:10 AM   Modules accepted: Orders

## 2024-11-24 NOTE — Telephone Encounter (Signed)
 Copied from CRM #[id number]. Topic: Clinical - Medication Refill >> Nov 23, 2024  4:26 PM Roselie BROCKS wrote: Medication: isinopril-hydrochlorothiazide  (ZESTO potassium chloride  SA (KLOR-CON  M) 20 MEQ tablet   Has the patient contacted their pharmacy? Yes (Agent: If no, request that the patient contact the pharmacy for the refill. If patient does not wish to contact the pharmacy document the reason why and proceed with request.) (Agent: If yes, when and what did the pharmacy advise?)  This is the patient's preferred pharmacy:   [facility] [address] [facility] - [place], [place] - [address] [place] [address] Phone: [phone] Fax: [phone]  Is this the correct pharmacy for this prescription? Yes If no, delete pharmacy and type the correct one.   Has the prescription been filled recently? No  Is the patient out of the medication? Yes  Has the patient been seen for an appointment in the last year OR does the patient have an upcoming appointment? Yes  Can we respond through MyChart? No  Agent: Please be advised that Rx refills may take up to 3 business days. We ask that you follow-up with your pharmacy.

## 2025-01-11 ENCOUNTER — Encounter
